# Patient Record
Sex: Female | Born: 1990 | Marital: Married | Smoking: Former smoker
Health system: Southern US, Community
[De-identification: ages and names within clinical notes are randomized; demographics above are authoritative.]

## PROBLEM LIST (undated history)

## (undated) DIAGNOSIS — F419 Anxiety disorder, unspecified: Secondary | ICD-10-CM

## (undated) DIAGNOSIS — D649 Anemia, unspecified: Secondary | ICD-10-CM

## (undated) DIAGNOSIS — I82890 Acute embolism and thrombosis of other specified veins: Secondary | ICD-10-CM

## (undated) DIAGNOSIS — G35 Multiple sclerosis: Secondary | ICD-10-CM

## (undated) HISTORY — DX: Multiple sclerosis: G35

## (undated) HISTORY — PX: WISDOM TOOTH EXTRACTION: SHX21

## (undated) HISTORY — DX: Acute embolism and thrombosis of other specified veins: I82.890

## (undated) HISTORY — DX: Anemia, unspecified: D64.9

## (undated) HISTORY — DX: Anxiety disorder, unspecified: F41.9

---

## 2011-05-22 ENCOUNTER — Ambulatory Visit: Payer: PRIVATE HEALTH INSURANCE | Admitting: Physician Assistant

## 2011-05-22 VITALS — BP 114/74 | HR 106 | Temp 99.4°F | Resp 16 | Ht 63.5 in | Wt 138.0 lb

## 2011-05-22 DIAGNOSIS — J029 Acute pharyngitis, unspecified: Secondary | ICD-10-CM

## 2011-05-22 DIAGNOSIS — J039 Acute tonsillitis, unspecified: Secondary | ICD-10-CM

## 2011-05-22 DIAGNOSIS — R509 Fever, unspecified: Secondary | ICD-10-CM

## 2011-05-22 LAB — POCT CBC
HCT, POC: 39.8 % (ref 37.7–47.9)
Hemoglobin: 12.9 g/dL (ref 12.2–16.2)
Lymph, poc: 4.5 — AB (ref 0.6–3.4)
MCH, POC: 26.5 pg — AB (ref 27–31.2)
MCHC: 32.4 g/dL (ref 31.8–35.4)
WBC: 9 10*3/uL (ref 4.6–10.2)

## 2011-05-22 LAB — POCT RAPID STREP A (OFFICE): Rapid Strep A Screen: NEGATIVE

## 2011-05-22 MED ORDER — MAGIC MOUTHWASH W/LIDOCAINE: ORAL | Status: DC

## 2011-05-22 MED ORDER — IBUPROFEN 200 MG PO TABS
600.0000 mg | ORAL_TABLET | Freq: Once | ORAL | Status: AC
  Administered 2011-05-22: 600 mg via ORAL

## 2011-05-22 NOTE — Progress Notes (Signed)
  Subjective:    Patient ID: Sophia Hampton, female    DOB: 01-24-1991, 21 y.o.   MRN: [id number]  HPI  Pt presents to clinic with 3 days h/o sore throat and fever without sinus congestion or cough.  She does have some PND.  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  She has had mono in the past. She is here because she wants to see someone for a diagnosis.  Review of Systems  Constitutional: Positive for fever (100.3 t max - resolves with tylenol use). Negative for chills.  HENT: Positive for sore throat and postnasal drip. Negative for congestion and rhinorrhea.   Respiratory: Negative for cough.   Gastrointestinal: Negative for nausea, vomiting and diarrhea.  Neurological: Negative for dizziness and headaches.       Objective:   Physical Exam  Constitutional: She is oriented to person, place, and time. She appears well-developed and well-nourished.  HENT:  Head: Normocephalic and atraumatic.  Right Ear: Hearing, tympanic membrane, external ear and ear canal normal.  Left Ear: Hearing, tympanic membrane, external ear and ear canal normal. No decreased hearing is noted.  Nose: Nose normal.  Mouth/Throat: Oropharyngeal exudate, posterior oropharyngeal edema (R>L swollen - not quite touching but close -- no soft palate swelling - no uvula deviation) and posterior oropharyngeal erythema present. No tonsillar abscesses.  Eyes: Conjunctivae are normal.  Neck: Neck supple.  Cardiovascular: Normal rate, regular rhythm and normal heart sounds.   Pulmonary/Chest: Effort normal and breath sounds normal.  Lymphadenopathy:    She has cervical adenopathy (AC enlarged L>R ).  Neurological: She is alert and oriented to person, place, and time.  Skin: Skin is warm and dry.  Psychiatric: She has a normal mood and affect. Her behavior is normal. Judgment and thought content normal.          Assessment & Plan:   1. Acute pharyngitis  POCT rapid strep A,  ibuprofen (ADVIL,MOTRIN) tablet 600 mg, POCT CBC  2. Fever  POCT CBC   Probably strep with a neg result due to abx use - Pt to continue Zithromax because neg strep and nl CBC it is working.  D/w pt use of motrin for pain and continuing tylenol also.  D/w pt because of abx use not completely sure what is happening.  But if she is not improved in 3 days after finished with abx she needs to RTC if she is worse in the next 24-48h RTC because of the possibility of tonsillar abscess due to the fact that on exam her R tonsil is larger than her L but with nl CBC and no soft palate swelling will continue to monitor at this time.

## 2012-01-01 ENCOUNTER — Emergency Department (HOSPITAL_COMMUNITY)
Admission: EM | Admit: 2012-01-01 | Discharge: 2012-01-01 | Disposition: A | Discharge status: Home / Self Care | Payer: Medicaid Other | Attending: Emergency Medicine | Admitting: Emergency Medicine

## 2012-01-01 ENCOUNTER — Encounter (HOSPITAL_COMMUNITY): Payer: Self-pay | Admitting: Emergency Medicine

## 2012-01-01 DIAGNOSIS — R209 Unspecified disturbances of skin sensation: Secondary | ICD-10-CM | POA: Insufficient documentation

## 2012-01-01 DIAGNOSIS — X58XXXA Exposure to other specified factors, initial encounter: Secondary | ICD-10-CM | POA: Insufficient documentation

## 2012-01-01 DIAGNOSIS — Y9239 Other specified sports and athletic area as the place of occurrence of the external cause: Secondary | ICD-10-CM | POA: Insufficient documentation

## 2012-01-01 DIAGNOSIS — Z79899 Other long term (current) drug therapy: Secondary | ICD-10-CM | POA: Insufficient documentation

## 2012-01-01 DIAGNOSIS — T148XXA Other injury of unspecified body region, initial encounter: Secondary | ICD-10-CM | POA: Insufficient documentation

## 2012-01-01 DIAGNOSIS — Y9343 Activity, gymnastics: Secondary | ICD-10-CM | POA: Insufficient documentation

## 2012-01-01 MED ORDER — DIAZEPAM 5 MG PO TABS: 5.0000 mg | ORAL_TABLET | Freq: Two times a day (BID) | ORAL | Status: DC

## 2012-01-01 MED ORDER — KETOROLAC TROMETHAMINE 60 MG/2ML IM SOLN
60.0000 mg | Freq: Once | INTRAMUSCULAR | Status: AC
  Administered 2012-01-01: 60 mg via INTRAMUSCULAR
  Filled 2012-01-01: qty 2

## 2012-01-01 MED ORDER — DIAZEPAM 5 MG PO TABS
5.0000 mg | ORAL_TABLET | Freq: Once | ORAL | Status: AC
Start: 2012-01-01 — End: 2012-01-01
  Administered 2012-01-01: 5 mg via ORAL
  Filled 2012-01-01: qty 1

## 2012-01-01 NOTE — ED Notes (Signed)
Pt presents w/ upper back and shoulder pain (torticollis) after receiving massage from boyfriend. Pt worked out at gym yesterday and felt the need for a massage. Woke during the noc around 0300 w/ upper back pain, pain between shoulder blades, pain when turning head, numbness and tingling both upper extremities. Rx'ed w/ Advil x 2 today.

## 2012-01-01 NOTE — ED Provider Notes (Signed)
History     CSN: [id number]  Arrival date & time 01/01/12  1653   First MD Initiated Contact with Patient 01/01/12 2005      Chief Complaint  Patient presents with  . Neck Pain    upper extremity weakness    (Consider location/radiation/quality/duration/timing/severity/associated sxs/prior treatment) HPI History provided by pt.  Pt developed pain between her shoulder blades while driving at 1am today.  Had been sore yesterday afternoon after using the butterfly machine for the first time at the gym but pain became severe.  Aggravated by raising her arms and rotating head to the right and improved by wearing a soft collar.  Radiates down bilateral upper arms and associated w/ UE paresthesias.  Denies fever, CP, SOB, cough and pain is not pleuritic.  Denies recent trauma.   History reviewed. No pertinent past medical history.  History reviewed. No pertinent past surgical history.  No family history on file.  History  Substance Use Topics  . Smoking status: Never Smoker   . Smokeless tobacco: Never Used  . Alcohol Use: No    OB History    Grav Para Term Preterm Abortions TAB SAB Ect Mult Living                  Review of Systems  All other systems reviewed and are negative.    Allergies  Review of patient's allergies indicates no known allergies.  Home Medications   Current Outpatient Rx  Name  Route  Sig  Dispense  Refill  . NORETHIN ACE-ETH ESTRAD-FE 1-20 MG-MCG PO TABS   Oral   Take 1 tablet by mouth daily.           BP 129/81  Pulse 85  Temp 98.7 F (37.1 C) (Oral)  Resp 18  SpO2 100%  LMP 12/29/2011  Physical Exam  Nursing note and vitals reviewed. Constitutional: She is oriented to person, place, and time. She appears well-developed and well-nourished. No distress.  HENT:  Head: Normocephalic and atraumatic.  Eyes:       Normal appearance  Neck: Normal range of motion.  Cardiovascular: Normal rate, regular rhythm and intact distal pulses.    Pulmonary/Chest: Effort normal and breath sounds normal. No respiratory distress.       Pt points to pain at right upper back.  No tenderness in this location.  Entire spine non-tender.  Pain reproduced w/ abduction both upper extremities.    Musculoskeletal: Normal range of motion. She exhibits no edema and no tenderness.       Upper and LE strength and sensation nml.    Neurological: She is alert and oriented to person, place, and time.  Skin: Skin is warm and dry. No rash noted.  Psychiatric: She has a normal mood and affect. Her behavior is normal.    ED Course  Procedures (including critical care time)  Labs Reviewed - No data to display No results found.   1. Muscle strain      Medications  diazepam (VALIUM) 5 MG tablet (not administered)  ketorolac (TORADOL) injection 60 mg (60 mg Intramuscular Given 01/01/12 2041)  diazepam (VALIUM) tablet 5 mg (5 mg Oral Given 01/01/12 2042)     MDM  21yo healthy F presents w/ c/o non-traumatic upper back pain.  Used butterfly machine for the first time at gym yesterday, pain aggravated by head and UE ROM and improves w/ wearing soft collar.  Suspect muscle strain.  Doubt pneumonia; no fever or cough and doubt PE,  low risk, pain is not pleuritic, nml VS and no signs of DVT on exam.  Pt to receive IM toradol and po valium.  Will reassess shortly. 8:29 PM   Pain much improved.  Return precautions discussed.  Pt d/c'd home.  9:27 PM         Otilio Miu, [place] 01/01/12 2127

## 2012-01-01 NOTE — ED Notes (Addendum)
Pt reports pain between scapula that radiates to her right arm. Progressively worsened on yesterday. Seen at urgent care, and was encouraged to come to the ED.   Pt has a neck brace on. Was placed on for the idea of support. States that when she wears the neck brace it helps minimize pain with movement. She has sharp catch in her neck when she turns head from side to side..driving

## 2012-01-04 NOTE — ED Provider Notes (Signed)
Medical screening examination/treatment/procedure(s) were performed by non-physician practitioner and as supervising physician I was immediately available for consultation/collaboration.  Abrian Hanover R. Ashlan Dignan, MD 01/04/12 0702 

## 2012-10-14 ENCOUNTER — Emergency Department (HOSPITAL_COMMUNITY)
Admission: EM | Admit: 2012-10-14 | Discharge: 2012-10-14 | Disposition: A | Discharge status: Home / Self Care | Payer: Medicaid Other | Attending: Emergency Medicine | Admitting: Emergency Medicine

## 2012-10-14 ENCOUNTER — Encounter (HOSPITAL_COMMUNITY): Payer: Self-pay | Admitting: *Deleted

## 2012-10-14 DIAGNOSIS — M436 Torticollis: Secondary | ICD-10-CM | POA: Insufficient documentation

## 2012-10-14 DIAGNOSIS — Z7982 Long term (current) use of aspirin: Secondary | ICD-10-CM | POA: Insufficient documentation

## 2012-10-14 DIAGNOSIS — Z87891 Personal history of nicotine dependence: Secondary | ICD-10-CM | POA: Insufficient documentation

## 2012-10-14 DIAGNOSIS — Z79899 Other long term (current) drug therapy: Secondary | ICD-10-CM | POA: Insufficient documentation

## 2012-10-14 MED ORDER — KETOROLAC TROMETHAMINE 30 MG/ML IJ SOLN
30.0000 mg | Freq: Once | INTRAMUSCULAR | Status: AC
  Administered 2012-10-14: 30 mg via INTRAMUSCULAR
  Filled 2012-10-14: qty 1

## 2012-10-14 MED ORDER — DIAZEPAM 5 MG PO TABS: 5.0000 mg | ORAL_TABLET | Freq: Four times a day (QID) | ORAL | Status: DC | PRN

## 2012-10-14 MED ORDER — DIAZEPAM 5 MG PO TABS
5.0000 mg | ORAL_TABLET | Freq: Once | ORAL | Status: AC
  Administered 2012-10-14: 5 mg via ORAL
  Filled 2012-10-14: qty 1

## 2012-10-14 NOTE — ED Provider Notes (Signed)
CSN: [id number]     Arrival date & time 10/14/12  2014 History  This chart was scribed for non-physician practitioner, Earley Favor, FNP working with Shon Baton, MD by Greggory Stallion, ED scribe. This patient was seen in room WTR6/WTR6 and the patient's care was started at 9:16 PM.   Chief Complaint  Patient presents with  . Torticollis   The history is provided by the patient. No language interpreter was used.    HPI Comments: Sophia Hampton is a 22 y.o. female who presents to the Emergency Department complaining of sudden onset, constant neck spasms that started this morning when she woke up. Pt denies injury. She states she has taken tramadol and advil with no relief. Pt can not move her neck and has placed a soft cervical color for comfort. She has no other associated symptoms. LNMP was 3 weeks ago.   History reviewed. No pertinent past medical history. History reviewed. No pertinent past surgical history. No family history on file. History  Substance Use Topics  . Smoking status: Former Games developer  . Smokeless tobacco: Never Used  . Alcohol Use: Yes   OB History   Grav Para Term Preterm Abortions TAB SAB Ect Mult Living                 Review of Systems  HENT: Positive for neck pain.   All other systems reviewed and are negative.    Allergies  Review of patient's allergies indicates no known allergies.  Home Medications   Current Outpatient Rx  Name  Route  Sig  Dispense  Refill  . aspirin 325 MG tablet   Oral   Take 325 mg by mouth daily.         Marland Kitchen escitalopram (LEXAPRO) 10 MG tablet   Oral   Take 10 mg by mouth daily.         Marland Kitchen ibuprofen (ADVIL,MOTRIN) 200 MG tablet   Oral   Take 400 mg by mouth every 6 (six) hours as needed for pain.         Marland Kitchen norethindrone-ethinyl estradiol (JUNEL FE,GILDESS FE,LOESTRIN FE) 1-20 MG-MCG tablet   Oral   Take 1 tablet by mouth daily.         . traMADol (ULTRAM) 50 MG tablet   Oral   Take 50 mg by mouth every 6  (six) hours as needed for pain.         . diazepam (VALIUM) 5 MG tablet   Oral   Take 1 tablet (5 mg total) by mouth every 6 (six) hours as needed for anxiety.   12 tablet   0    BP 131/80  Pulse 75  Temp(Src) 98.5 F (36.9 C) (Oral)  Resp 18  Wt 126 lb (57.153 kg)  BMI 21.97 kg/m2  SpO2 100%  LMP 09/14/2012  Physical Exam  Nursing note and vitals reviewed. Constitutional: She is oriented to person, place, and time. She appears well-developed and well-nourished. No distress.  HENT:  Head: Normocephalic and atraumatic.  Eyes: EOM are normal.  Neck: Neck supple. No tracheal deviation present.  Cardiovascular: Normal rate.   Pulmonary/Chest: Effort normal. No respiratory distress.  Musculoskeletal: Normal range of motion.  Neurological: She is alert and oriented to person, place, and time.  Skin: Skin is warm and dry.  Psychiatric: She has a normal mood and affect. Her behavior is normal.    ED Course   Procedures (including critical care time)  DIAGNOSTIC STUDIES: Oxygen Saturation is 100%  on RA, normal by my interpretation.    COORDINATION OF CARE: 9:18 PM-Discussed treatment plan which includes a muscle relaxer and pain medication with pt at bedside and pt agreed to plan.   Labs Reviewed - No data to display No results found. 1. Neck stiffness     MDM    Will dc home with Rx for Valium      I personally performed the services described in this documentation, which was scribed in my presence. The recorded information has been reviewed and is accurate.   Arman Filter, NP 10/15/12 [phone]

## 2012-10-14 NOTE — ED Notes (Signed)
Pt states if she moves her neck "it spasms" she has placed a soft cervical collar on for comfort, states she was seen for the same in Nov

## 2012-10-15 NOTE — ED Provider Notes (Signed)
Medical screening examination/treatment/procedure(s) were performed by non-physician practitioner and as supervising physician I was immediately available for consultation/collaboration.  Shon Baton, MD 10/15/12 [phone]

## 2013-02-12 DIAGNOSIS — G4700 Insomnia, unspecified: Secondary | ICD-10-CM | POA: Diagnosis present

## 2013-02-12 DIAGNOSIS — F32A Depression, unspecified: Secondary | ICD-10-CM | POA: Diagnosis present

## 2014-05-07 ENCOUNTER — Ambulatory Visit: Payer: PRIVATE HEALTH INSURANCE | Admitting: Neurology

## 2014-06-03 IMAGING — US US PELVIS COMPLETE
1 series · 14 of 25 positions shown · non-contrast
Comparison: None

CLINICAL DATA: Acute onset of lower abdominal pain. Initial
encounter.

EXAM:
TRANSABDOMINAL AND TRANSVAGINAL ULTRASOUND OF PELVIS
TECHNIQUE: Both transabdominal and transvaginal ultrasound examinations of the
pelvis were performed. Transabdominal technique was performed for
global imaging of the pelvis including uterus, ovaries, adnexal
regions, and pelvic cul-de-sac. It was necessary to proceed with
endovaginal exam following the transabdominal exam to visualize the
uterus and ovaries in greater detail.

[Series 1: us pelvis complete · 0.24mm/px · 14 of 58 slices shown]
[im 1/58]
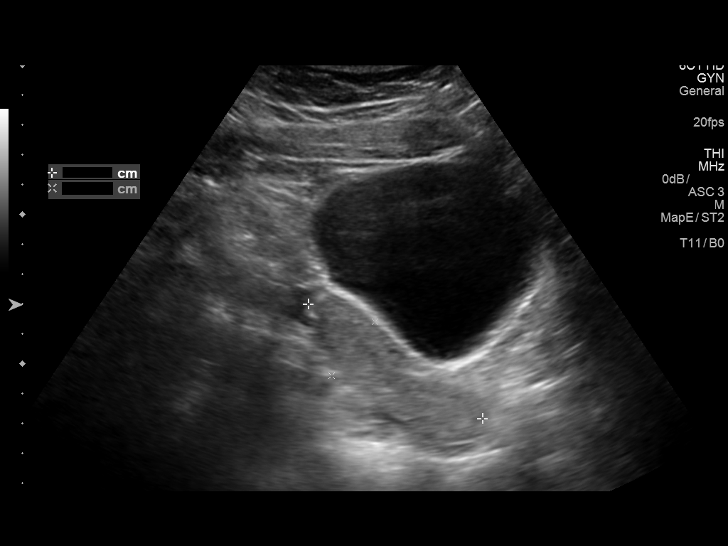
[im 5/58]
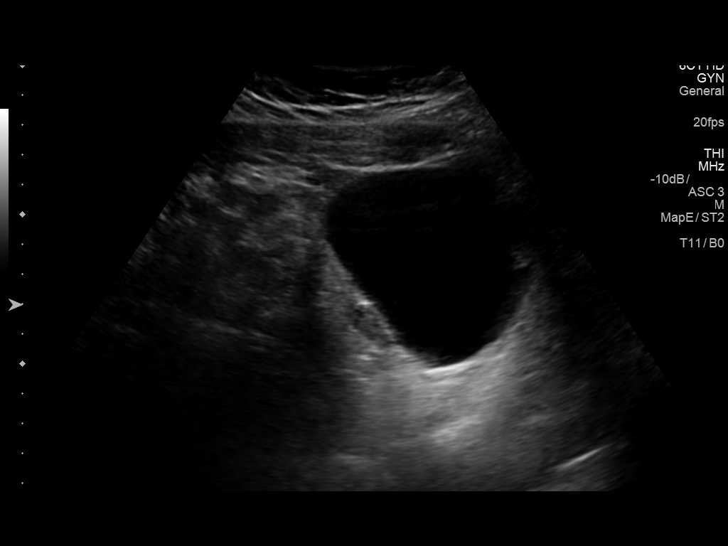
[im 10/58]
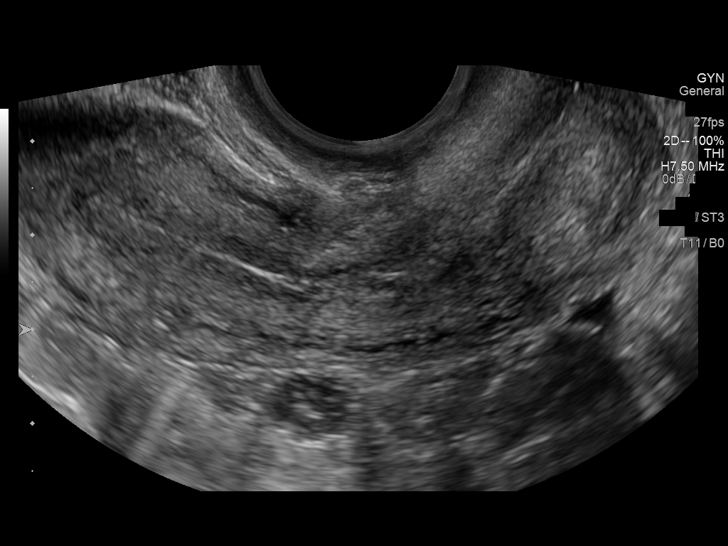
[im 15/58]
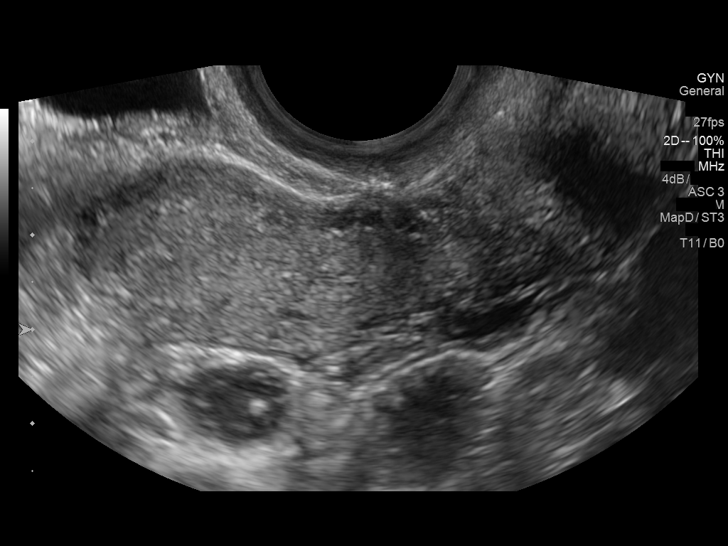
[im 20/58]
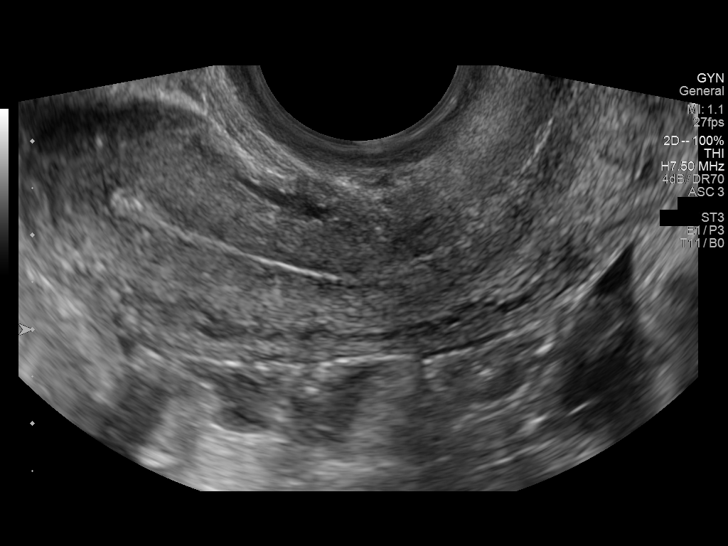
[im 22/58]
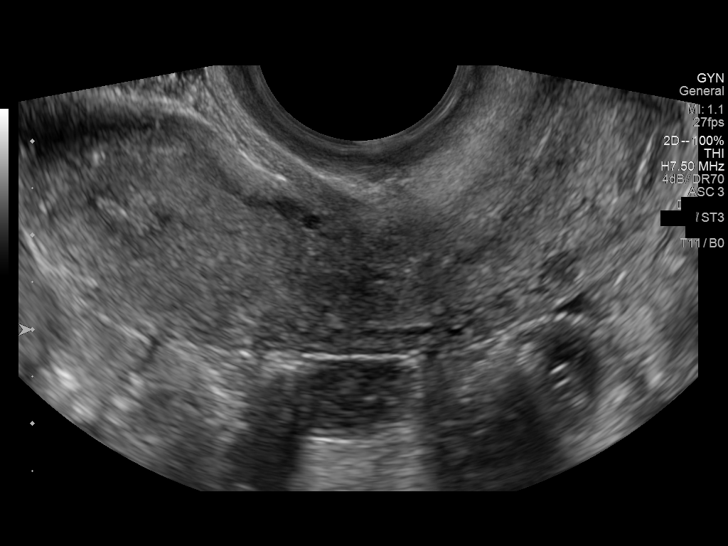
[im 27/58]
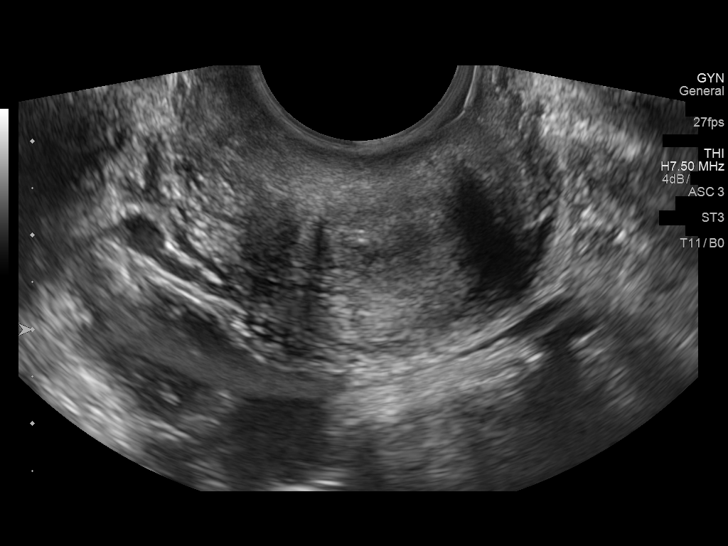
[im 31/58]
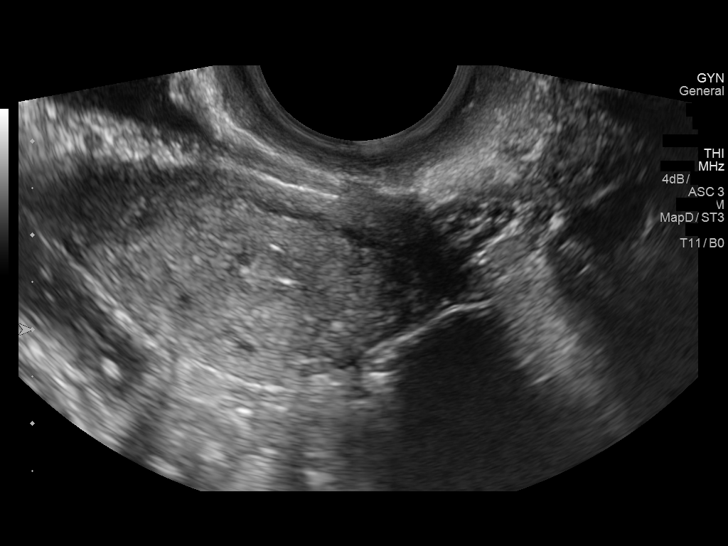
[im 36/58]
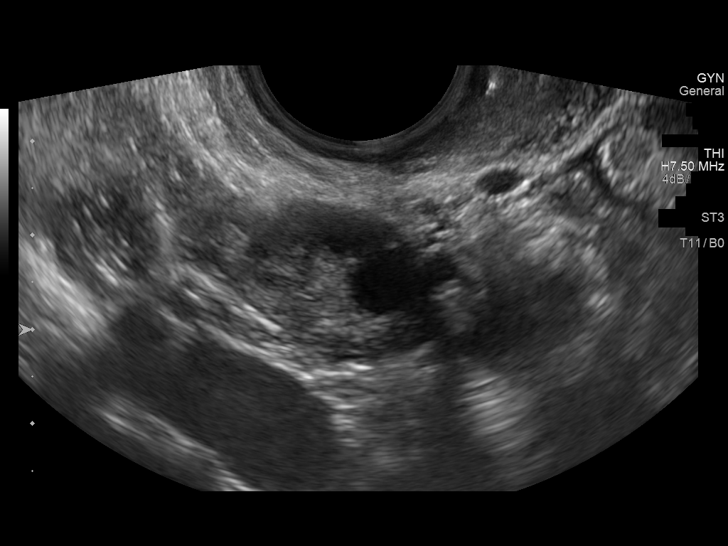
[im 39/58]
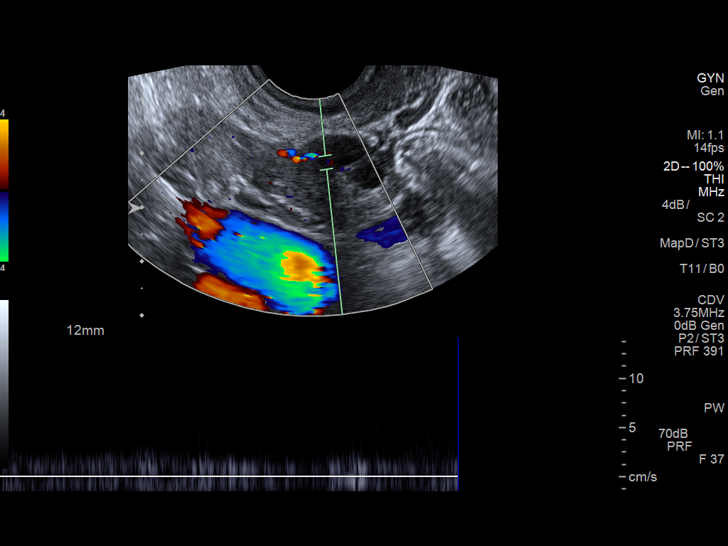
[im 43/58]
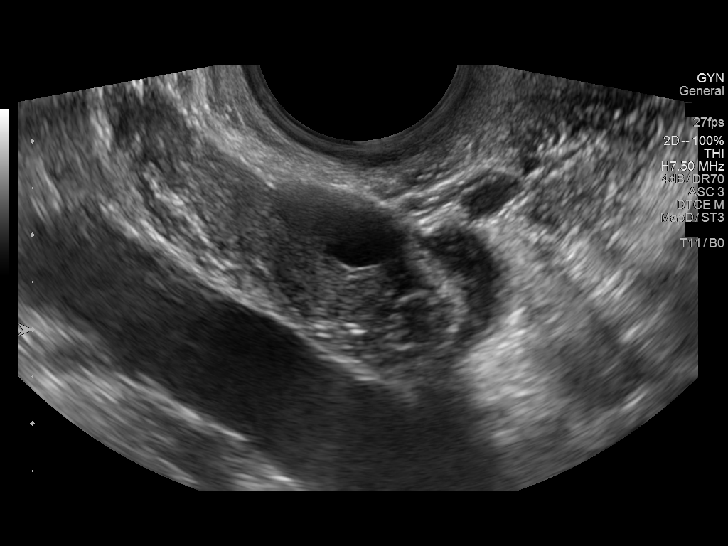
[im 48/58]
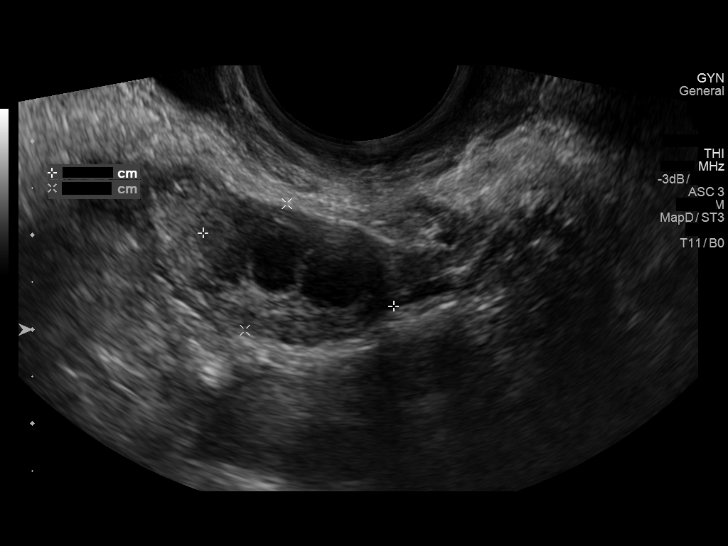
[im 53/58]
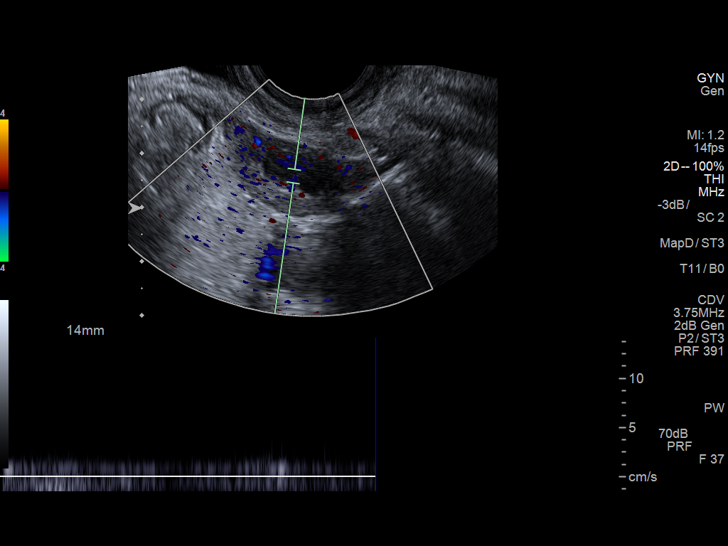
[im 58/58]
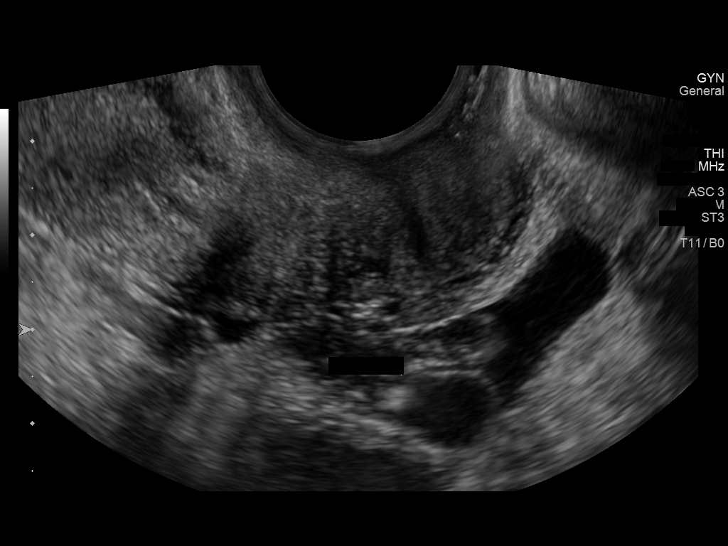

[14 of 25 positions shown; findings below may reference images not displayed]

FINDINGS: Uterus

Measurements: 7.0 x 2.3 x 3.8 cm. No fibroids or other mass
visualized.

Endometrium

Thickness: 0.3 cm.  No focal abnormality visualized.

Right ovary

Measurements: 2.2 x 1.6 x 1.7 cm. Normal appearance/no adnexal mass.

Left ovary

Measurements: 2.2 x 1.4 x 1.8 cm. Normal appearance/no adnexal mass.

Other findings

Trace relatively simple free fluid is seen within the pelvic
cul-de-sac, likely physiologic in nature.
IMPRESSION: 1. Uterus unremarkable in appearance. No evidence for ovarian
torsion.
2. Trace relatively simple free fluid within the pelvic cul-de-sac,
likely physiologic in nature.

## 2014-12-31 ENCOUNTER — Emergency Department (HOSPITAL_COMMUNITY)
Admission: EM | Admit: 2014-12-31 | Discharge: 2014-12-31 | Disposition: A | Discharge status: Home / Self Care | Payer: 59 | Source: Home / Self Care | Attending: Family Medicine | Admitting: Family Medicine

## 2014-12-31 ENCOUNTER — Encounter (HOSPITAL_COMMUNITY): Payer: Self-pay | Admitting: *Deleted

## 2014-12-31 DIAGNOSIS — S29012A Strain of muscle and tendon of back wall of thorax, initial encounter: Secondary | ICD-10-CM | POA: Diagnosis not present

## 2014-12-31 MED ORDER — METAXALONE 800 MG PO TABS: 800.0000 mg | ORAL_TABLET | Freq: Three times a day (TID) | ORAL | Status: DC

## 2014-12-31 MED ORDER — DICLOFENAC POTASSIUM 50 MG PO TABS: 50.0000 mg | ORAL_TABLET | Freq: Three times a day (TID) | ORAL | Status: DC

## 2014-12-31 NOTE — ED Provider Notes (Signed)
CSN: [id number]     Arrival date & time 12/31/14  1740 History   First MD Initiated Contact with Patient 12/31/14 1853     Chief Complaint  Patient presents with  . Back Pain   (Consider location/radiation/quality/duration/timing/severity/associated sxs/prior Treatment) Patient is a 24 y.o. female presenting with back pain. The history is provided by the patient.  Back Pain Location:  Thoracic spine Quality:  Stabbing Radiates to:  Does not radiate Pain severity:  Mild Onset quality:  Gradual Duration:  2 weeks Progression:  Unchanged Chronicity:  New Context: emotional stress   Context: not lifting heavy objects   Ineffective treatments:  Muscle relaxants Associated symptoms: no abdominal pain, no abdominal swelling, no fever and no paresthesias     History reviewed. No pertinent past medical history. History reviewed. No pertinent past surgical history. History reviewed. No pertinent family history. Social History  Substance Use Topics  . Smoking status: Former Games developer  . Smokeless tobacco: Never Used  . Alcohol Use: Yes   OB History    No data available     Review of Systems  Constitutional: Negative.  Negative for fever.  Gastrointestinal: Negative.  Negative for abdominal pain.  Musculoskeletal: Positive for back pain and neck pain. Negative for joint swelling, gait problem and neck stiffness.  Skin: Negative.   Neurological: Negative for paresthesias.  All other systems reviewed and are negative.   Allergies  Review of patient's allergies indicates no known allergies.  Home Medications   Prior to Admission medications   Medication Sig Start Date End Date Taking? Authorizing Provider  aspirin 325 MG tablet Take 325 mg by mouth daily.    Historical Provider, MD  diazepam (VALIUM) 5 MG tablet Take 1 tablet (5 mg total) by mouth every 6 (six) hours as needed for anxiety. 10/14/12   Earley Favor, NP  diclofenac (CATAFLAM) 50 MG tablet Take 1 tablet (50 mg total)  by mouth 3 (three) times daily. 12/31/14   Linna Hoff, MD  escitalopram (LEXAPRO) 10 MG tablet Take 10 mg by mouth daily.    Historical Provider, MD  ibuprofen (ADVIL,MOTRIN) 200 MG tablet Take 400 mg by mouth every 6 (six) hours as needed for pain.    Historical Provider, MD  metaxalone (SKELAXIN) 800 MG tablet Take 1 tablet (800 mg total) by mouth 3 (three) times daily. As muscle relaxer 12/31/14   Linna Hoff, MD  norethindrone-ethinyl estradiol (JUNEL FE,GILDESS FE,LOESTRIN FE) 1-20 MG-MCG tablet Take 1 tablet by mouth daily.    Historical Provider, MD  traMADol (ULTRAM) 50 MG tablet Take 50 mg by mouth every 6 (six) hours as needed for pain.    Historical Provider, MD   Meds Ordered and Administered this Visit  Medications - No data to display  BP 122/80 mmHg  Pulse 76  Temp(Src) 97.7 F (36.5 C) (Oral)  Resp 16  SpO2 100%  LMP 12/31/2014 No data found.   Physical Exam  Constitutional: She is oriented to person, place, and time. She appears well-developed and well-nourished. No distress.  Cardiovascular: Normal rate, regular rhythm, normal heart sounds and intact distal pulses.   Pulmonary/Chest: Effort normal and breath sounds normal.  Abdominal: Soft. Bowel sounds are normal.  Musculoskeletal: She exhibits tenderness.       Back:  Neurological: She is alert and oriented to person, place, and time.  Skin: Skin is warm and dry.  Nursing note and vitals reviewed.   ED Course  Procedures (including critical care time)  Labs  Review Labs Reviewed - No data to display  Imaging Review No results found.   Visual Acuity Review  Right Eye Distance:   Left Eye Distance:   Bilateral Distance:    Right Eye Near:   Left Eye Near:    Bilateral Near:         MDM   1. Rhomboid muscle strain, initial encounter    rx skelaxin, diclofenac and heat and stretch.   Linna Hoff, MD 12/31/14 Barry Brunner

## 2014-12-31 NOTE — Discharge Instructions (Signed)
Heat, stretch and medicine as prescribed. Return if needed.

## 2014-12-31 NOTE — ED Notes (Signed)
Pt  Reports      Upper  Back  Neck  And   Shoulder     Pain  For  sev  Weeks        denys  Any  specefic  Injury      seen  At  Another  Urgent  Care     And  Was     rx      Muscle  Relaxant

## 2015-02-02 ENCOUNTER — Emergency Department (HOSPITAL_COMMUNITY): Payer: 59

## 2015-02-02 ENCOUNTER — Emergency Department (HOSPITAL_COMMUNITY)
Admission: EM | Admit: 2015-02-02 | Discharge: 2015-02-03 | Disposition: A | Discharge status: Home / Self Care | Payer: 59 | Attending: Emergency Medicine | Admitting: Emergency Medicine

## 2015-02-02 ENCOUNTER — Encounter (HOSPITAL_COMMUNITY): Payer: Self-pay | Admitting: Family Medicine

## 2015-02-02 DIAGNOSIS — Z3202 Encounter for pregnancy test, result negative: Secondary | ICD-10-CM | POA: Insufficient documentation

## 2015-02-02 DIAGNOSIS — N898 Other specified noninflammatory disorders of vagina: Secondary | ICD-10-CM | POA: Insufficient documentation

## 2015-02-02 DIAGNOSIS — Z79899 Other long term (current) drug therapy: Secondary | ICD-10-CM | POA: Diagnosis not present

## 2015-02-02 DIAGNOSIS — Z793 Long term (current) use of hormonal contraceptives: Secondary | ICD-10-CM | POA: Diagnosis not present

## 2015-02-02 DIAGNOSIS — R509 Fever, unspecified: Secondary | ICD-10-CM | POA: Diagnosis not present

## 2015-02-02 DIAGNOSIS — R1030 Lower abdominal pain, unspecified: Secondary | ICD-10-CM

## 2015-02-02 DIAGNOSIS — R110 Nausea: Secondary | ICD-10-CM | POA: Diagnosis not present

## 2015-02-02 DIAGNOSIS — R1031 Right lower quadrant pain: Secondary | ICD-10-CM | POA: Diagnosis present

## 2015-02-02 DIAGNOSIS — R109 Unspecified abdominal pain: Secondary | ICD-10-CM

## 2015-02-02 LAB — CBC
HEMATOCRIT: 42.7 % (ref 36.0–46.0)
HEMOGLOBIN: 13.8 g/dL (ref 12.0–15.0)
MCH: 27.3 pg (ref 26.0–34.0)
MCHC: 32.3 g/dL (ref 30.0–36.0)
MCV: 84.6 fL (ref 78.0–100.0)
Platelets: 246 10*3/uL (ref 150–400)
RBC: 5.05 MIL/uL (ref 3.87–5.11)
RDW: 13.4 % (ref 11.5–15.5)
WBC: 8.1 10*3/uL (ref 4.0–10.5)

## 2015-02-02 LAB — COMPREHENSIVE METABOLIC PANEL
ALBUMIN: 3.7 g/dL (ref 3.5–5.0)
ALK PHOS: 52 U/L (ref 38–126)
ALT: 22 U/L (ref 14–54)
AST: 19 U/L (ref 15–41)
Anion gap: 9 (ref 5–15)
BILIRUBIN TOTAL: 1 mg/dL (ref 0.3–1.2)
BUN: 9 mg/dL (ref 6–20)
CALCIUM: 9.7 mg/dL (ref 8.9–10.3)
CO2: 26 mmol/L (ref 22–32)
Chloride: 104 mmol/L (ref 101–111)
Creatinine, Ser: 0.82 mg/dL (ref 0.44–1.00)
GFR calc Af Amer: >60 mL/min (ref >60)
GFR calc non Af Amer: >60 mL/min (ref >60)
GLUCOSE: 90 mg/dL (ref 65–99)
POTASSIUM: 3.9 mmol/L (ref 3.5–5.1)
Sodium: 139 mmol/L (ref 135–145)
TOTAL PROTEIN: 7 g/dL (ref 6.5–8.1)

## 2015-02-02 LAB — I-STAT BETA HCG BLOOD, ED (MC, WL, AP ONLY): I-stat hCG, quantitative: <5 m[IU]/mL (ref <5)

## 2015-02-02 LAB — WET PREP, GENITAL
Clue Cells Wet Prep HPF POC: NONE SEEN
SPERM: NONE SEEN
TRICH WET PREP: NONE SEEN
YEAST WET PREP: NONE SEEN

## 2015-02-02 LAB — URINALYSIS, ROUTINE W REFLEX MICROSCOPIC
BILIRUBIN URINE: NEGATIVE
Glucose, UA: NEGATIVE mg/dL
Ketones, ur: NEGATIVE mg/dL
Leukocytes, UA: NEGATIVE
NITRITE: NEGATIVE
PH: 6.5 (ref 5.0–8.0)
PROTEIN: 100 mg/dL — AB
SPECIFIC GRAVITY, URINE: 1.005 (ref 1.005–1.030)

## 2015-02-02 LAB — LIPASE, BLOOD: Lipase: 22 U/L (ref 11–51)

## 2015-02-02 LAB — URINE MICROSCOPIC-ADD ON: Bacteria, UA: NONE SEEN

## 2015-02-02 IMAGING — CT CT ABD-PELV W/ CM
2 of 4 series · 11 of 46 positions shown, 12 images · IV contrast (Iodine)
Comparison: None.

CLINICAL DATA: 24-year-old female with right lower quadrant
abdominal pain

EXAM:
CT ABDOMEN AND PELVIS WITH CONTRAST
TECHNIQUE: Multidetector CT imaging of the abdomen and pelvis was performed
using the standard protocol following bolus administration of
intravenous contrast.
CONTRAST:  80mL OMNIPAQUE IOHEXOL 300 MG/ML  SOLN

[Series 201: routine, idose (2) · axial · 0.71mm/px · z∈[-126,+229]mm · 8 of 87 slices shown, 9 images]
[im 8/87  soft-tissue]
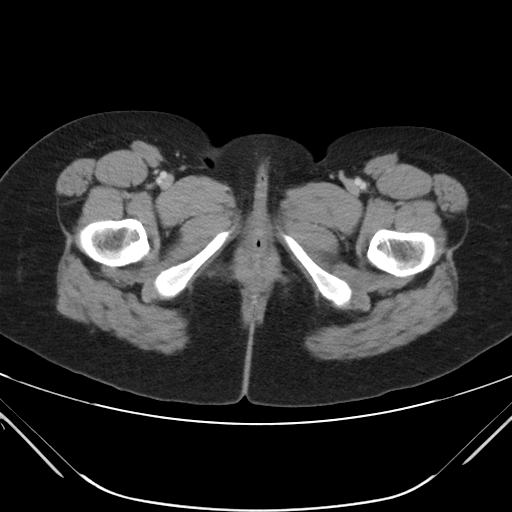
[im 8/87  bone]
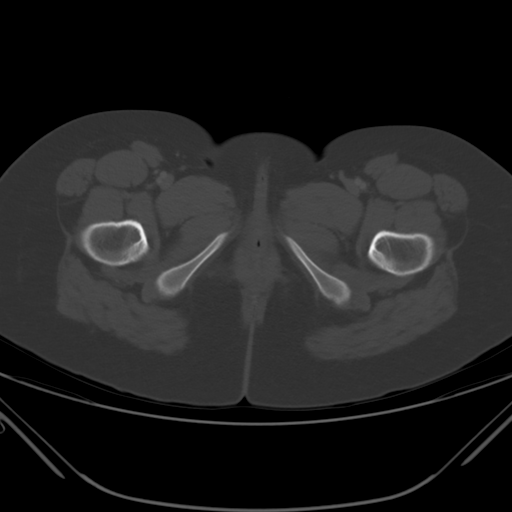
[im 18/87  soft-tissue]
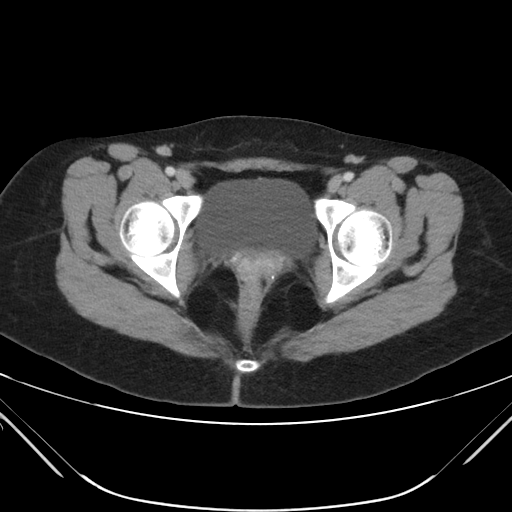
[im 29/87  soft-tissue]
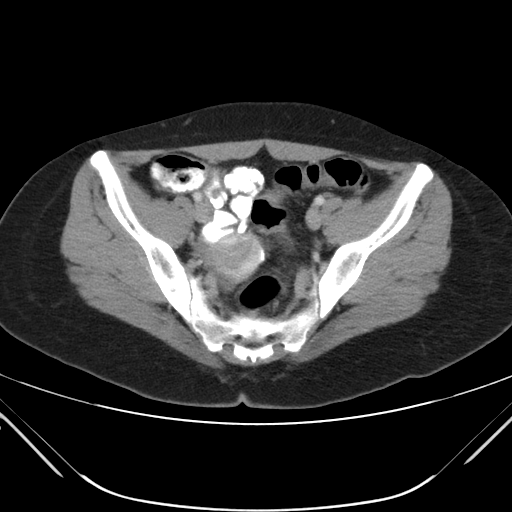
[im 40/87  soft-tissue]
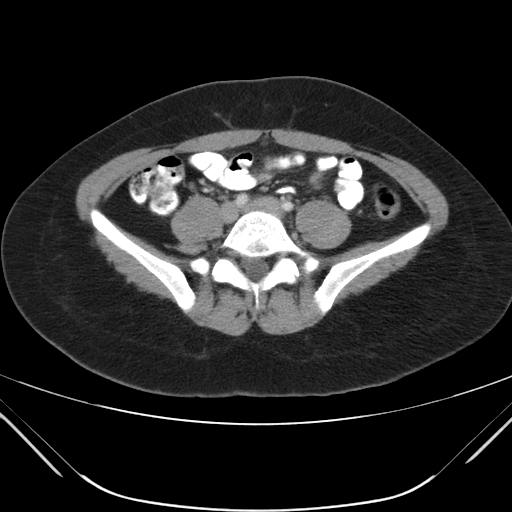
[im 47/87  soft-tissue]
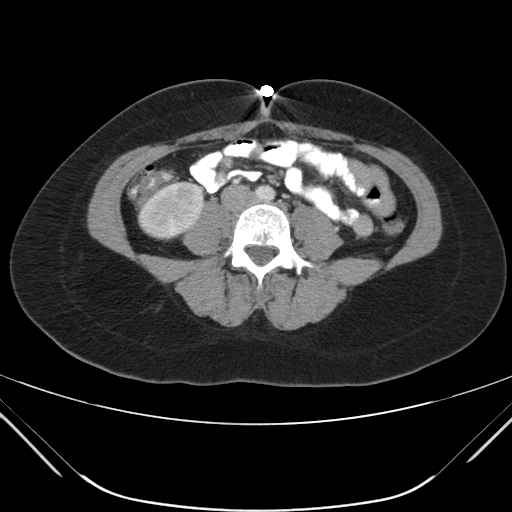
[im 58/87  soft-tissue]
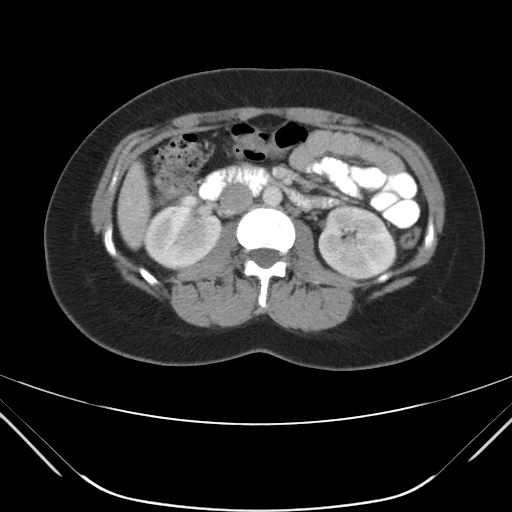
[im 69/87  soft-tissue]
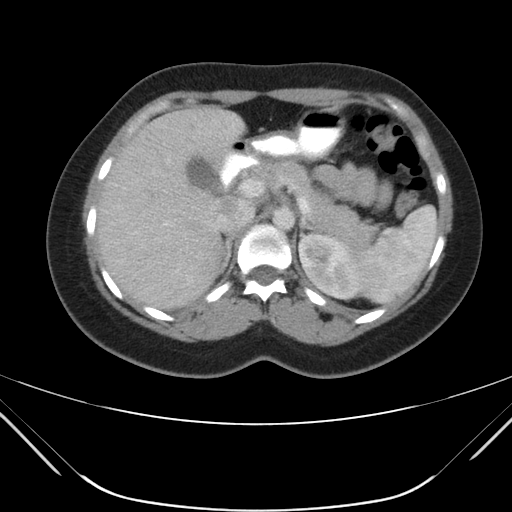
[im 79/87  soft-tissue]
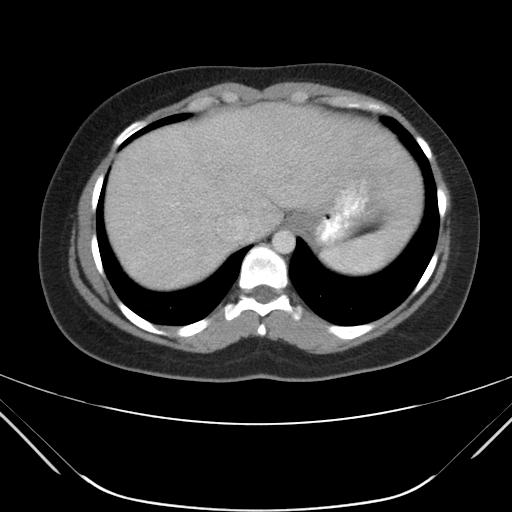

[Series 203: coronals, idose (2) · coronal · 0.45mm/px · 3 of 122 slices shown]
[im 41/122  soft-tissue]
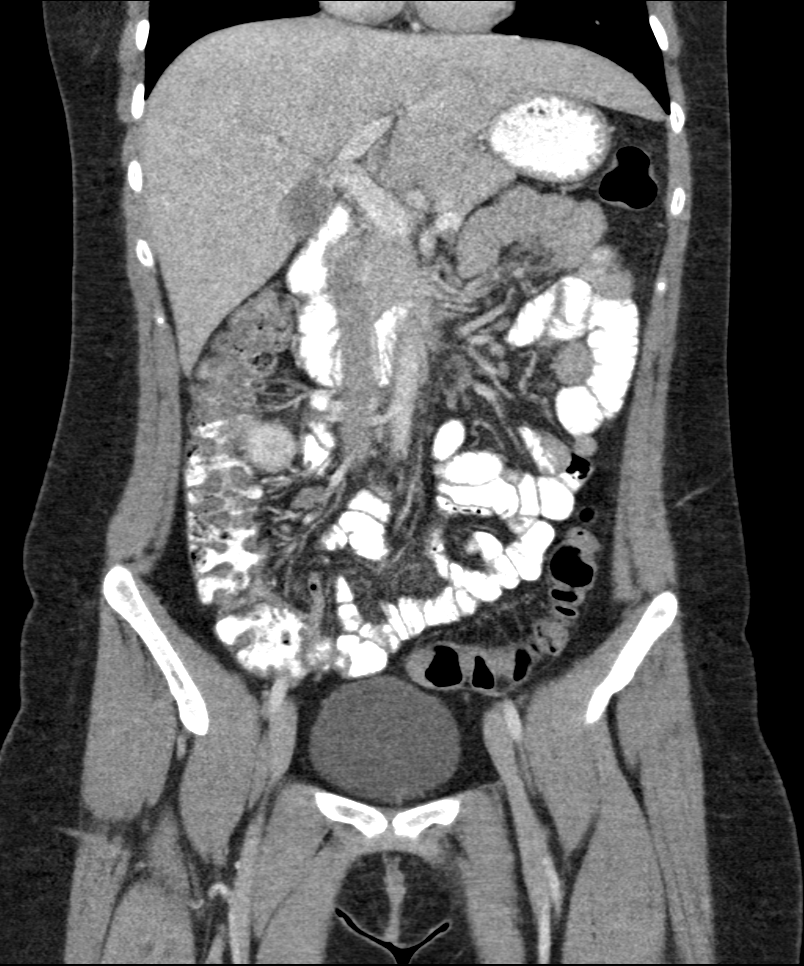
[im 54/122  soft-tissue]
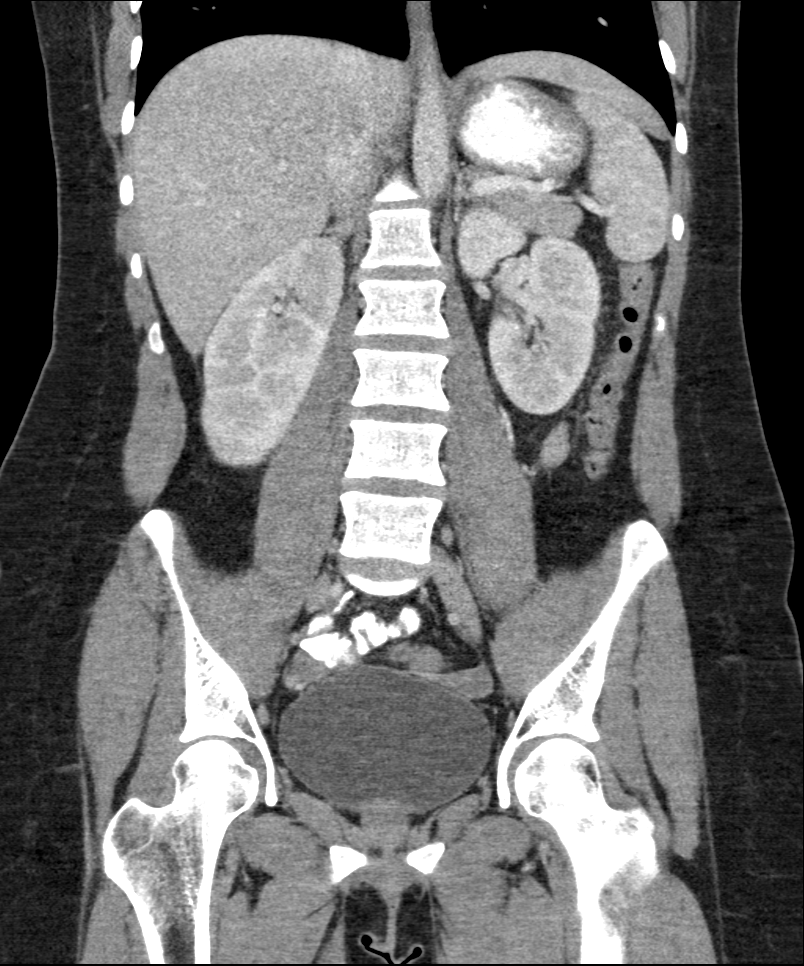
[im 68/122  soft-tissue]
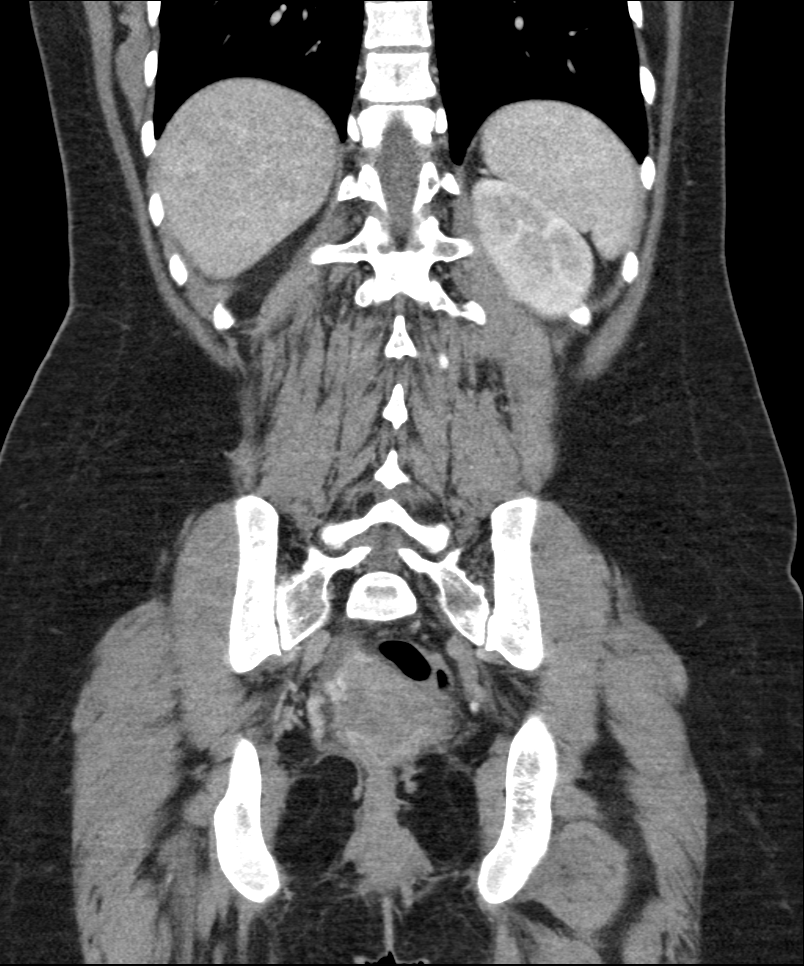

[11 of 46 positions shown; findings below may reference images not displayed]

FINDINGS: The visualized lung bases are clear. No intra-abdominal free air.
Small slightly high attenuating fluid noted in the right posterior
hemipelvis, likely complex fluid.

The liver, gallbladder, pancreas, spleen, adrenal glands, left
kidney, left ureter, and urinary bladder appear unremarkable. There
is incomplete rotation of the right kidney. There is mild haziness
at the right renal sinus fat. Correlation with urinalysis
recommended to exclude a. there is no hydronephrosis on either side.
The uterus is anteverted and appears grossly unremarkable. A small
corpus luteum may be present in the right ovary. Ultrasound may
provide better evaluation of the pelvic structures.

There is no evidence of bowel obstruction or inflammation. Normal
appendix. The abdominal aorta and IVC are patent. No portal venous
gas identified. There is no adenopathy. Small fat containing
umbilical hernia. The abdominal wall soft tissues appear
unremarkable. The osseous structures are intact.
IMPRESSION: No evidence of bowel obstruction or inflammation.  Normal appendix.

Small minimally complex fluid within the pelvis possibly related to
right ovarian follicular rupture. Pelvic ultrasound may provide
better evaluation.

Minimal stranding of the right renal pelvis fat. Correlation with
urinalysis recommended to exclude UTI.

## 2015-02-02 MED ORDER — ONDANSETRON 4 MG PO TBDP
4.0000 mg | ORAL_TABLET | Freq: Once | ORAL | Status: AC
  Administered 2015-02-02: 4 mg via ORAL
  Filled 2015-02-02: qty 1

## 2015-02-02 MED ORDER — OXYCODONE-ACETAMINOPHEN 5-325 MG PO TABS
ORAL_TABLET | ORAL | Status: AC
  Filled 2015-02-02: qty 1

## 2015-02-02 MED ORDER — IOHEXOL 300 MG/ML  SOLN
80.0000 mL | Freq: Once | INTRAMUSCULAR | Status: AC | PRN
  Administered 2015-02-02: 80 mL via INTRAVENOUS

## 2015-02-02 MED ORDER — OXYCODONE-ACETAMINOPHEN 5-325 MG PO TABS
1.0000 | ORAL_TABLET | Freq: Once | ORAL | Status: AC
  Administered 2015-02-02: 1 via ORAL
  Filled 2015-02-02: qty 1

## 2015-02-02 MED ORDER — OXYCODONE-ACETAMINOPHEN 5-325 MG PO TABS
1.0000 | ORAL_TABLET | Freq: Once | ORAL | Status: AC
  Administered 2015-02-02: 1 via ORAL

## 2015-02-02 MED ORDER — SODIUM CHLORIDE 0.9 % IV BOLUS (SEPSIS)
1000.0000 mL | Freq: Once | INTRAVENOUS | Status: AC
  Administered 2015-02-02: 1000 mL via INTRAVENOUS

## 2015-02-02 NOTE — ED Notes (Signed)
CT notified pt completed contrast.  

## 2015-02-02 NOTE — ED Notes (Signed)
CT called for wait time and informed RN will assist for transport.

## 2015-02-02 NOTE — ED Provider Notes (Signed)
CSN: [id number]     Arrival date & time 02/02/15  1249 History   First MD Initiated Contact with Patient 02/02/15 1750     Chief Complaint  Patient presents with  . Abdominal Pain    HPI   Sophia Hampton is a 24 y.o. female with no pertinent PMH who presents to the ED with constant lower abdominal pain and nausea x 2 days. She states her pain is located in her RLQ and radiates across her abdomen. She reports fever to 100.6 at home. She denies exacerbating factors. She has not tried anything for symptom relief. She was evaluated at urgent care today and instructed to come to the ED for possible appendicitis. She denies vomiting, diarrhea, constipation, dysuria, urgency, frequency, vaginal discharge. She states she just finished her menstrual cycle.    History reviewed. No pertinent past medical history. History reviewed. No pertinent past surgical history. History reviewed. No pertinent family history. Social History  Substance Use Topics  . Smoking status: Former Games developer  . Smokeless tobacco: Never Used  . Alcohol Use: Yes   OB History    No data available       Review of Systems  Constitutional: Positive for fever. Negative for chills.  Respiratory: Negative for shortness of breath.   Cardiovascular: Negative for chest pain.  Gastrointestinal: Positive for nausea and abdominal pain. Negative for vomiting, diarrhea, constipation and blood in stool.  Genitourinary: Negative for dysuria, urgency, frequency and vaginal discharge.  All other systems reviewed and are negative.     Allergies  Review of patient's allergies indicates no known allergies.  Home Medications   Prior to Admission medications   Medication Sig Start Date End Date Taking? Authorizing Provider  acetaminophen (TYLENOL) 325 MG tablet Take 650 mg by mouth every 6 (six) hours as needed for mild pain or moderate pain.   Yes Historical Provider, MD  escitalopram (LEXAPRO) 10 MG tablet Take 15 mg by mouth daily.     Yes Historical Provider, MD  norethindrone-ethinyl estradiol (JUNEL FE,GILDESS FE,LOESTRIN FE) 1-20 MG-MCG tablet Take 1 tablet by mouth daily.   Yes Historical Provider, MD  diazepam (VALIUM) 5 MG tablet Take 1 tablet (5 mg total) by mouth every 6 (six) hours as needed for anxiety. Patient not taking: Reported on 02/02/2015 10/14/12   Earley Favor, NP  diclofenac (CATAFLAM) 50 MG tablet Take 1 tablet (50 mg total) by mouth 3 (three) times daily. Patient not taking: Reported on 02/02/2015 12/31/14   Linna Hoff, MD  metaxalone (SKELAXIN) 800 MG tablet Take 1 tablet (800 mg total) by mouth 3 (three) times daily. As muscle relaxer Patient not taking: Reported on 02/02/2015 12/31/14   Linna Hoff, MD    BP 120/84 mmHg  Pulse 60  Temp(Src) 98.7 F (37.1 C) (Oral)  Resp 18  Ht  (1.575 m)  Wt 68.04 kg  BMI 27.43 kg/m2  SpO2 100%  LMP 01/29/2015 Physical Exam  Constitutional: She is oriented to person, place, and time. She appears well-developed and well-nourished. No distress.  HENT:  Head: Normocephalic and atraumatic.  Right Ear: External ear normal.  Left Ear: External ear normal.  Nose: Nose normal.  Mouth/Throat: Uvula is midline, oropharynx is clear and moist and mucous membranes are normal.  Eyes: Conjunctivae, EOM and lids are normal. Pupils are equal, round, and reactive to light. Right eye exhibits no discharge. Left eye exhibits no discharge. No scleral icterus.  Neck: Normal range of motion. Neck supple.  Cardiovascular: Normal  rate, regular rhythm, normal heart sounds, intact distal pulses and normal pulses.   Pulmonary/Chest: Effort normal and breath sounds normal. No respiratory distress. She has no wheezes. She has no rales.  Abdominal: Soft. Normal appearance and bowel sounds are normal. She exhibits no distension and no mass. There is tenderness. There is rebound. There is no rigidity and no guarding.  Mild TTP in RLQ.  Genitourinary: Uterus is not enlarged and not  tender. Cervix exhibits no motion tenderness, no discharge and no friability. Right adnexum displays no mass, no tenderness and no fullness. Left adnexum displays no mass, no tenderness and no fullness. No erythema, tenderness or bleeding in the vagina. No foreign body around the vagina. No signs of injury around the vagina. Vaginal discharge found.  Small amount of brown discharge present in vaginal vault.  Musculoskeletal: Normal range of motion. She exhibits no edema or tenderness.  Neurological: She is alert and oriented to person, place, and time. She has normal strength. No sensory deficit.  Skin: Skin is warm, dry and intact. No rash noted. She is not diaphoretic. No erythema. No pallor.  Psychiatric: She has a normal mood and affect. Her speech is normal and behavior is normal.  Nursing note and vitals reviewed.   ED Course  Procedures (including critical care time)  Labs Review Labs Reviewed  WET PREP, GENITAL - Abnormal; Notable for the following:    WBC, Wet Prep HPF POC MODERATE (*)    All other components within normal limits  URINALYSIS, ROUTINE W REFLEX MICROSCOPIC (NOT AT [facility]) - Abnormal; Notable for the following:    Hgb urine dipstick SMALL (*)    Protein, ur 100 (*)    All other components within normal limits  URINE MICROSCOPIC-ADD ON - Abnormal; Notable for the following:    Squamous Epithelial / LPF 0-5 (*)    All other components within normal limits  LIPASE, BLOOD  COMPREHENSIVE METABOLIC PANEL  CBC  I-STAT BETA HCG BLOOD, ED (MC, WL, AP ONLY)  GC/CHLAMYDIA PROBE AMP ([place]) NOT AT [facility]    Imaging Review No results found.   I have personally reviewed and evaluated these images and lab results as part of my medical decision-making.   EKG Interpretation None      MDM   Final diagnoses:  Abdominal pain, unspecified abdominal location  Nausea    24 year old female presents with lower abdominal pain and nausea for the past 2 days. Reports  fever at home.  Denies vomiting, diarrhea, constipation, dysuria, urgency, frequency, vaginal discharge.   Patient is afebrile. Vital signs stable. Heart regular rate and rhythm. Lungs clear to auscultation bilaterally. Mild tenderness to palpation of right lower quadrant with guarding. No rebound or masses. Small amount of brown discharge present in vaginal vault. No cervical motion tenderness or cervical friability. No adnexal or uterine tenderness.  Patient given percocet for pain per protocol while waiting to be evaluated.  CBC negative for leukocytosis or anemia. CMP unremarkable. Lipase within normal limits. Beta hCG negative. UA negative for infection. Wet prep remarkable for moderate WBC. Do not feel treatment for STD is indicated at this time given patient denies vaginal discharge.  Patient discussed with and seen by Dr. Ranae Palms. Will obtain CT abdomen pelvis for further evaluation of patient's symptoms. Patient signed out to Earley Favor, NP at shift change. CT abdomen pelvis pending. If negative, feel patient is stable for discharge home with PCP follow-up.  BP 120/84 mmHg  Pulse 60  Temp(Src) 98.7 F (  37.1 C) (Oral)  Resp 18  Ht  (1.575 m)  Wt 68.04 kg  BMI 27.43 kg/m2  SpO2 100%  LMP 01/29/2015     Mady Gemma, PA-C 02/02/15 2036  Loren Racer, MD 02/02/15 2221

## 2015-02-02 NOTE — ED Notes (Signed)
Pt here for lower abd pain, more on the right side with nausea. sts also she is very thirsty and urinating a lot.

## 2015-02-02 NOTE — ED Notes (Signed)
Contrast given to patient

## 2015-02-02 NOTE — ED Notes (Signed)
Pt to Ultrasound at this time.

## 2015-02-03 LAB — GC/CHLAMYDIA PROBE AMP (~~LOC~~) NOT AT ARMC
Chlamydia: NEGATIVE
NEISSERIA GONORRHEA: NEGATIVE

## 2015-02-03 MED ORDER — IBUPROFEN 600 MG PO TABS: 600.0000 mg | ORAL_TABLET | Freq: Four times a day (QID) | ORAL | Status: DC | PRN

## 2015-02-03 MED ORDER — OXYCODONE-ACETAMINOPHEN 5-325 MG PO TABS: 1.0000 | ORAL_TABLET | Freq: Four times a day (QID) | ORAL | Status: DC | PRN

## 2015-02-03 NOTE — Discharge Instructions (Signed)
1. Medications: usual home medications 2. Treatment: rest, drink plenty of fluids, 3. Follow Up: please followup with your primary doctor this week for discussion of your diagnoses and further evaluation after today's visit; if you do not have a primary care doctor use the resource guide provided to find one; please return to the ER for high fever, severe pain, vomiting, new or worsening symptoms   Abdominal Pain, Adult Many things can cause abdominal pain. Usually, abdominal pain is not caused by a disease and will improve without treatment. It can often be observed and treated at home. Your health care provider will do a physical exam and possibly order blood tests and X-rays to help determine the seriousness of your pain. However, in many cases, more time must pass before a clear cause of the pain can be found. Before that point, your health care provider may not know if you need more testing or further treatment. HOME CARE INSTRUCTIONS Monitor your abdominal pain for any changes. The following actions may help to alleviate any discomfort you are experiencing:  Only take over-the-counter or prescription medicines as directed by your health care provider.  Do not take laxatives unless directed to do so by your health care provider.  Try a clear liquid diet (broth, tea, or water) as directed by your health care provider. Slowly move to a bland diet as tolerated. SEEK MEDICAL CARE IF:  You have unexplained abdominal pain.  You have abdominal pain associated with nausea or diarrhea.  You have pain when you urinate or have a bowel movement.  You experience abdominal pain that wakes you in the night.  You have abdominal pain that is worsened or improved by eating food.  You have abdominal pain that is worsened with eating fatty foods.  You have a fever. SEEK IMMEDIATE MEDICAL CARE IF:  Your pain does not go away within 2 hours.  You keep throwing up (vomiting).  Your pain is felt  only in portions of the abdomen, such as the right side or the left lower portion of the abdomen.  You pass bloody or black tarry stools. MAKE SURE YOU:  Understand these instructions.  Will watch your condition.  Will get help right away if you are not doing well or get worse.   This information is not intended to replace advice given to you by your health care provider. Make sure you discuss any questions you have with your health care provider.   Document Released: 11/24/2004 Document Revised: 11/05/2014 Document Reviewed: 10/24/2012 Elsevier Interactive Patient Education 2016 [employer].   Emergency Department Resource Guide 1) Find a Doctor and Pay Out of Pocket Although you won't have to find out who is covered by your insurance plan, it is a good idea to ask around and get recommendations. You will then need to call the office and see if the doctor you have chosen will accept you as a new patient and what types of options they offer for patients who are self-pay. Some doctors offer discounts or will set up payment plans for their patients who do not have insurance, but you will need to ask so you aren't surprised when you get to your appointment.  2) Contact Your Local Health Department Not all health departments have doctors that can see patients for sick visits, but many do, so it is worth a call to see if yours does. If you don't know where your local health department is, you can check in your phone book. The  CDC also has a tool to help you locate your state's health department, and many state websites also have listings of all of their local health departments.  3) Find a Walk-in Clinic If your illness is not likely to be very severe or complicated, you may want to try a walk in clinic. These are popping up all over the country in pharmacies, drugstores, and shopping centers. They're usually staffed by nurse practitioners or physician assistants that have been trained to treat  common illnesses and complaints. They're usually fairly quick and inexpensive. However, if you have serious medical issues or chronic medical problems, these are probably not your best option.  No Primary Care Doctor: - Call Health Connect at  [phone] - they can help you locate a primary care doctor that  accepts your insurance, provides certain services, etc. - Physician Referral Service- [phone]  Chronic Pain Problems: Organization         Address  Phone   Notes  [facility]  [phone] Patients need to be referred by their primary care doctor.   Medication Assistance: Organization         Address  Phone   Notes  [facility] Medication [facility] [address] [place] [place], [place] [address] [phone] --Must be a resident of [facility] -- Must have NO insurance coverage whatsoever (no Medicaid/ Medicare, etc.) -- The pt. MUST have a primary care doctor that directs their care regularly and follows them in the community   Med[facility]ist  [phone]   [employer]  [phone]    Agencies that provide inexpensive medical care: Organization         Address  Phone   Notes  [facility]  [phone]   [facility]    [phone]   [facility] [address] [place], [place] [address] [phone]   [facility] [address] [place] [place] [phone]   Planned Parenthood    [phone]   [facility]    [phone]   [facility]  [address], [place] Phone:  [phone], Fax:  [phone] Hours of Operation:  9 am - 6 pm, M-F.  Also accepts Medicaid/Medicare and self-pay.  [facility]  [address], [place] Phone: [phone], Fax: [phone]. Hours of Operation:  8:30 am - 5:30 pm, M-F.  Also accepts Medicaid and self-pay.  [facility] [place] [address], [place] Phone: [phone]   [facility] [address] Natasha Bence [place], [place] [phone], Ext. [phone] Mondays & Thursdays: 7-9 AM.  First 15 patients are seen on a first come, first serve basis.    Medicaid-accepting [facility] Providers:  Organization         Address  Phone   Notes  [facility] [address],  [phone] Also accepts self-pay patients.  [facility] [address] Laurell Josephs [place], [place]  [phone]   [facility] [address], [place] [phone]   [facility] [address] [place] [phone]   Renaye Rakers [address], [place]   [phone] Only accepts [place] Access [place] patients after they have their name applied to their card.   Self-Pay (no  insurance) in [place]:  Organization         Address  Phone   Notes  Sickle Cell Patients, [facility] [address] [place], [place] [phone]   [facility] [address] [place], [place] [phone]   [facility] [place]  [address], [place] [phone]   [facility]/Dr. Osei-Bonsu  [address] [place] or [address], [place] [phone] Phone number for both [place] and [place] locations is the same.  [facility] [address], [place] [phone]   [facility] [address] [place] or [address] [phone]   [facility] [address], [place] [phone], phone; [phone], fax Sees patients 1st and 3rd Saturday of every month.  Must not qualify for public or private insurance (i.e. Medicaid, Medicare, [place] Health Choice, Veterans' Benefits)  Household income should be no more than 200% of the  poverty level The clinic cannot treat you if you are pregnant or think you are pregnant  Sexually transmitted diseases are not treated at the clinic.    Dental Care: Organization         Address  Phone  Notes  [facility] Department [facility] [address] [place], [place] [phone] Accepts children up to age 18 who are enrolled in [place] or [place] Health Choice; pregnant women with a Medicaid card; and children who have applied for Medicaid or [place] Health Choice, but were declined, whose parents can pay a reduced fee at time of service.  [facility]  [address], [place] [phone] Accepts children up to age 68 who are enrolled in [place] or [place] Health Choice; pregnant women with a Medicaid card; and children who have applied for Medicaid or  Health Choice, but were declined, whose parents can pay a reduced fee at time of service.  [facility]  [address] [place], [place] [phone] Patients are seen by appointment only. Walk-ins are not accepted. [facility] will see patients 23 years of age and older. Monday - Tuesday (8am-5pm) Most Wednesdays (8:30-5pm) $30 per visit, cash only  [facility] Adult [facility] Access [facility]  [address], [facility] [phone] Patients are seen by appointment only. Walk-ins are not accepted. [facility] will see patients 62 years of age and older. One Wednesday Evening (Monthly: Volunteer Based).  $30 per visit, cash only  [employer]  [phone] for adults; Children under age 48, call Graduate Pediatric Dentistry at [phone]. Children aged 67-14, please call [phone] to request a pediatric application.  Dental services are provided in all areas of dental care including fillings, crowns and bridges, complete and partial dentures, implants, gum treatment, root canals, and extractions.  Preventive care is also provided. Treatment is provided to both adults and children. Patients are selected via a lottery and there is often a waiting list.   [facility] [address] [place]  [phone] [email or web address]   [facility] [address] [place], [place] [phone], Ext. [phone] Second and Fourth Thursday of each month, opens at 6:30 AM; Clinic ends at 9 AM.  Patients are seen on a first-come first-served basis, and a limited number are seen  during each clinic.   [facility]  [address] Griff[address] [place], [place] [phone]   Eligibility Requirements You must have lived in [place], [place], or [place] counties for at least the last three months.   You cannot be eligible for state or federal sponsored [place], including [employer], [place], or [employer].   You generally cannot be eligible for healthcare insurance through your employer.    How to apply: Eligibility screenings are held every Tuesday and Wednesday afternoon from 1:00 pm until 4:00 pm. You do not need an appointment for the interview!  [facility] [address] [place], [place] [phone]   [facility] Health Department  [phone]   [facility] Health Department  [phone]   [facility] Health Department  [phone]    Behavioral Health Resources in the Community: Intensive Outpatient Programs Organization         Address  Phone  Notes  [facility] Services [address] [place], [place] [phone]   [facility] [address] [place], [place] [phone]   [facility] [address], [place], [place]  [phone]   [facility] Mental Health [address],  [place], [place] [phone] or [phone]   Substance Abuse Resources Organization         Address  Phone  Notes  Alcohol [facility] Drug Services  [phone]   Addiction  [facility]  [phone]   [facility] [place]  [phone]   Floydene Flock  [phone]   Residential & Outpatient Substance Abuse Program  [phone]   Psychological Services Organization         Address  Phone  Notes  [facility]  [phone]   [facility] Services  [phone]   [facility] Mental Health [address] [place] [phone] or [phone]    Mobile Crisis Teams Organization         Address  Phone  Notes  [facility], Mobile Crisis Care Unit  [phone]   [facility]  [address] [place], [place] [phone]   Doristine Locks [address] [place] [phone]    Self-Help/Support Groups Organization         Address  Phone             Notes  [facility] [place] - variety of support groups  336- I7437963 Call for more information  Narcotics Anonymous (NA), [facility] [address], [employer] [place]  2 meetings at this location   Statistician         Address  Phone  Notes  [facility] [address],    [place]  [phone]   [facility]  [address] [place] [address], [place], [place] [phone]   [facility] [address] [place], [place] [phone] Admissions: 8am-3pm M-F  [facility] [address]    [place], [place] [phone]   [facility] [address] Starling Manns [place], [place] [phone]   [facility] [address]  [place], [place] [phone]   [facility] - Intensive Outpatient [address] Laurell Josephs [address], [place], [place] [phone]   [facility] [address] [place]  [place], [place] [phone] or [phone]   [facility] ([facility]) [address] [place], [place] [phone] Accepts Medicaid  [facility] [address]  [place]  [place]  [phone] Substance Abuse/Addiction Treatment   [facility] Organization         Address  Phone  Notes  [facility]  [phone]   Angie Fava, PhD [address] Ervin Knack [place], [place]   2493851541 or [phone]   [facility] Behavioral   [address] [place], [place] [phone]   [facility] Recovery [address] [place], [place] [phone] Insurance/Medicaid/sponsorship through [facility] [address]                                    [place], [place] [phone] Therapy/tele-psych/case  [facility] [address], [place] [phone]    Dr. Lolly Mustache  [phone]   [facility] [place]  [employer] [facility] Dept. 1) [address] [place] 2) [address], [place] 3)  [address], [place] [phone]  [phone]   [facility] Child Abuse Hotline 281-669-0889 or [phone] (After Hours)     Your ultrasound is normal

## 2015-02-03 NOTE — ED Notes (Signed)
Pt requesting something to drink 

## 2015-02-03 NOTE — ED Provider Notes (Signed)
CT scan had questionable finding.  Recommended ultrasound.  Ultrasound was reviewed.  No all within normal parameters.  This was discussed with patient.  She is in agreement to go home.  She's been given a prescription for Motrin that she can take on a regular basis as well as Percocet if she needs for or severe pain.  She's also been given a resource list to help her find a primary care physician at the time of discharge, she is feeling significantly better.  She has eaten without any discomfort  Earley Favor, NP 02/03/15 0050  Loren Racer, MD 02/06/15 [phone]

## 2017-05-10 DIAGNOSIS — M62838 Other muscle spasm: Secondary | ICD-10-CM | POA: Diagnosis not present

## 2017-05-10 DIAGNOSIS — M542 Cervicalgia: Secondary | ICD-10-CM | POA: Diagnosis not present

## 2017-05-30 DIAGNOSIS — F411 Generalized anxiety disorder: Secondary | ICD-10-CM | POA: Diagnosis not present

## 2017-05-31 DIAGNOSIS — Z3009 Encounter for other general counseling and advice on contraception: Secondary | ICD-10-CM | POA: Diagnosis not present

## 2017-05-31 DIAGNOSIS — J069 Acute upper respiratory infection, unspecified: Secondary | ICD-10-CM | POA: Diagnosis not present

## 2017-05-31 DIAGNOSIS — Z713 Dietary counseling and surveillance: Secondary | ICD-10-CM | POA: Diagnosis not present

## 2017-05-31 DIAGNOSIS — J029 Acute pharyngitis, unspecified: Secondary | ICD-10-CM | POA: Diagnosis not present

## 2017-06-05 DIAGNOSIS — F411 Generalized anxiety disorder: Secondary | ICD-10-CM | POA: Diagnosis not present

## 2017-06-05 DIAGNOSIS — F428 Other obsessive-compulsive disorder: Secondary | ICD-10-CM | POA: Diagnosis not present

## 2017-06-05 DIAGNOSIS — F633 Trichotillomania: Secondary | ICD-10-CM | POA: Diagnosis not present

## 2017-06-26 DIAGNOSIS — F411 Generalized anxiety disorder: Secondary | ICD-10-CM | POA: Diagnosis not present

## 2017-07-10 DIAGNOSIS — L03119 Cellulitis of unspecified part of limb: Secondary | ICD-10-CM | POA: Diagnosis not present

## 2017-07-10 DIAGNOSIS — S60511A Abrasion of right hand, initial encounter: Secondary | ICD-10-CM | POA: Diagnosis not present

## 2017-07-13 DIAGNOSIS — F411 Generalized anxiety disorder: Secondary | ICD-10-CM | POA: Diagnosis not present

## 2017-07-13 DIAGNOSIS — S60511D Abrasion of right hand, subsequent encounter: Secondary | ICD-10-CM | POA: Diagnosis not present

## 2017-08-29 DIAGNOSIS — F633 Trichotillomania: Secondary | ICD-10-CM | POA: Diagnosis not present

## 2017-08-29 DIAGNOSIS — F428 Other obsessive-compulsive disorder: Secondary | ICD-10-CM | POA: Diagnosis not present

## 2017-08-29 DIAGNOSIS — F411 Generalized anxiety disorder: Secondary | ICD-10-CM | POA: Diagnosis not present

## 2017-11-08 DIAGNOSIS — F411 Generalized anxiety disorder: Secondary | ICD-10-CM | POA: Diagnosis not present

## 2017-11-21 DIAGNOSIS — F411 Generalized anxiety disorder: Secondary | ICD-10-CM | POA: Diagnosis not present

## 2017-12-08 DIAGNOSIS — Z23 Encounter for immunization: Secondary | ICD-10-CM | POA: Diagnosis not present

## 2017-12-11 DIAGNOSIS — J0100 Acute maxillary sinusitis, unspecified: Secondary | ICD-10-CM | POA: Diagnosis not present

## 2017-12-20 DIAGNOSIS — F411 Generalized anxiety disorder: Secondary | ICD-10-CM | POA: Diagnosis not present

## 2018-01-08 DIAGNOSIS — J209 Acute bronchitis, unspecified: Secondary | ICD-10-CM | POA: Diagnosis not present

## 2018-01-08 DIAGNOSIS — R0789 Other chest pain: Secondary | ICD-10-CM | POA: Diagnosis not present

## 2018-01-11 DIAGNOSIS — J0100 Acute maxillary sinusitis, unspecified: Secondary | ICD-10-CM | POA: Diagnosis not present

## 2018-01-16 DIAGNOSIS — F411 Generalized anxiety disorder: Secondary | ICD-10-CM | POA: Diagnosis not present

## 2018-02-09 DIAGNOSIS — F411 Generalized anxiety disorder: Secondary | ICD-10-CM | POA: Diagnosis not present

## 2018-02-27 DIAGNOSIS — F411 Generalized anxiety disorder: Secondary | ICD-10-CM | POA: Diagnosis not present

## 2018-03-20 DIAGNOSIS — F411 Generalized anxiety disorder: Secondary | ICD-10-CM | POA: Diagnosis not present

## 2018-04-21 DIAGNOSIS — R5383 Other fatigue: Secondary | ICD-10-CM | POA: Diagnosis not present

## 2018-04-21 DIAGNOSIS — R5381 Other malaise: Secondary | ICD-10-CM | POA: Diagnosis not present

## 2018-04-21 DIAGNOSIS — J329 Chronic sinusitis, unspecified: Secondary | ICD-10-CM | POA: Diagnosis not present

## 2018-04-21 DIAGNOSIS — R05 Cough: Secondary | ICD-10-CM | POA: Diagnosis not present

## 2018-07-20 DIAGNOSIS — F411 Generalized anxiety disorder: Secondary | ICD-10-CM | POA: Diagnosis not present

## 2018-07-26 DIAGNOSIS — F411 Generalized anxiety disorder: Secondary | ICD-10-CM | POA: Diagnosis not present

## 2018-08-01 DIAGNOSIS — J0100 Acute maxillary sinusitis, unspecified: Secondary | ICD-10-CM | POA: Diagnosis not present

## 2018-08-10 DIAGNOSIS — F411 Generalized anxiety disorder: Secondary | ICD-10-CM | POA: Diagnosis not present

## 2018-08-28 DIAGNOSIS — F411 Generalized anxiety disorder: Secondary | ICD-10-CM | POA: Diagnosis not present

## 2018-09-06 DIAGNOSIS — Z87891 Personal history of nicotine dependence: Secondary | ICD-10-CM | POA: Diagnosis not present

## 2018-09-06 DIAGNOSIS — Z13220 Encounter for screening for lipoid disorders: Secondary | ICD-10-CM | POA: Diagnosis not present

## 2018-09-06 DIAGNOSIS — R002 Palpitations: Secondary | ICD-10-CM | POA: Diagnosis not present

## 2018-09-06 DIAGNOSIS — Z6831 Body mass index (BMI) 31.0-31.9, adult: Secondary | ICD-10-CM | POA: Diagnosis not present

## 2018-09-06 DIAGNOSIS — Z0000 Encounter for general adult medical examination without abnormal findings: Secondary | ICD-10-CM | POA: Diagnosis not present

## 2018-09-06 DIAGNOSIS — F419 Anxiety disorder, unspecified: Secondary | ICD-10-CM | POA: Diagnosis not present

## 2018-09-20 DIAGNOSIS — F411 Generalized anxiety disorder: Secondary | ICD-10-CM | POA: Diagnosis not present

## 2018-10-09 DIAGNOSIS — F411 Generalized anxiety disorder: Secondary | ICD-10-CM | POA: Diagnosis not present

## 2018-11-07 DIAGNOSIS — F411 Generalized anxiety disorder: Secondary | ICD-10-CM | POA: Diagnosis not present

## 2018-11-21 DIAGNOSIS — F411 Generalized anxiety disorder: Secondary | ICD-10-CM | POA: Diagnosis not present

## 2018-12-12 DIAGNOSIS — F411 Generalized anxiety disorder: Secondary | ICD-10-CM | POA: Diagnosis not present

## 2019-01-22 DIAGNOSIS — F411 Generalized anxiety disorder: Secondary | ICD-10-CM | POA: Diagnosis not present

## 2019-04-24 DIAGNOSIS — Z01419 Encounter for gynecological examination (general) (routine) without abnormal findings: Secondary | ICD-10-CM | POA: Diagnosis not present

## 2019-04-24 DIAGNOSIS — E785 Hyperlipidemia, unspecified: Secondary | ICD-10-CM | POA: Diagnosis present

## 2019-04-24 DIAGNOSIS — N921 Excessive and frequent menstruation with irregular cycle: Secondary | ICD-10-CM | POA: Diagnosis not present

## 2019-04-24 DIAGNOSIS — Z6829 Body mass index (BMI) 29.0-29.9, adult: Secondary | ICD-10-CM | POA: Diagnosis not present

## 2019-07-03 DIAGNOSIS — R112 Nausea with vomiting, unspecified: Secondary | ICD-10-CM | POA: Diagnosis not present

## 2019-07-03 DIAGNOSIS — G43111 Migraine with aura, intractable, with status migrainosus: Secondary | ICD-10-CM | POA: Diagnosis not present

## 2019-08-09 DIAGNOSIS — F411 Generalized anxiety disorder: Secondary | ICD-10-CM | POA: Diagnosis not present

## 2019-08-16 DIAGNOSIS — F411 Generalized anxiety disorder: Secondary | ICD-10-CM | POA: Diagnosis not present

## 2019-08-23 DIAGNOSIS — F411 Generalized anxiety disorder: Secondary | ICD-10-CM | POA: Diagnosis not present

## 2019-09-06 DIAGNOSIS — F411 Generalized anxiety disorder: Secondary | ICD-10-CM | POA: Diagnosis not present

## 2019-09-09 DIAGNOSIS — E782 Mixed hyperlipidemia: Secondary | ICD-10-CM | POA: Diagnosis not present

## 2019-09-09 DIAGNOSIS — M25511 Pain in right shoulder: Secondary | ICD-10-CM | POA: Diagnosis not present

## 2019-09-09 DIAGNOSIS — F419 Anxiety disorder, unspecified: Secondary | ICD-10-CM | POA: Diagnosis not present

## 2019-09-09 DIAGNOSIS — Z0000 Encounter for general adult medical examination without abnormal findings: Secondary | ICD-10-CM | POA: Diagnosis not present

## 2019-09-13 DIAGNOSIS — F411 Generalized anxiety disorder: Secondary | ICD-10-CM | POA: Diagnosis not present

## 2019-09-20 DIAGNOSIS — Z20822 Contact with and (suspected) exposure to covid-19: Secondary | ICD-10-CM | POA: Diagnosis not present

## 2019-10-04 DIAGNOSIS — F411 Generalized anxiety disorder: Secondary | ICD-10-CM | POA: Diagnosis not present

## 2019-10-11 DIAGNOSIS — F411 Generalized anxiety disorder: Secondary | ICD-10-CM | POA: Diagnosis not present

## 2019-10-18 DIAGNOSIS — F411 Generalized anxiety disorder: Secondary | ICD-10-CM | POA: Diagnosis not present

## 2019-11-01 DIAGNOSIS — F411 Generalized anxiety disorder: Secondary | ICD-10-CM | POA: Diagnosis not present

## 2019-11-08 DIAGNOSIS — F411 Generalized anxiety disorder: Secondary | ICD-10-CM | POA: Diagnosis not present

## 2019-11-15 DIAGNOSIS — F411 Generalized anxiety disorder: Secondary | ICD-10-CM | POA: Diagnosis not present

## 2019-12-06 DIAGNOSIS — F411 Generalized anxiety disorder: Secondary | ICD-10-CM | POA: Diagnosis not present

## 2019-12-13 DIAGNOSIS — F411 Generalized anxiety disorder: Secondary | ICD-10-CM | POA: Diagnosis not present

## 2019-12-27 DIAGNOSIS — F411 Generalized anxiety disorder: Secondary | ICD-10-CM | POA: Diagnosis not present

## 2020-01-03 DIAGNOSIS — F411 Generalized anxiety disorder: Secondary | ICD-10-CM | POA: Diagnosis not present

## 2020-01-10 DIAGNOSIS — F411 Generalized anxiety disorder: Secondary | ICD-10-CM | POA: Diagnosis not present

## 2020-01-31 DIAGNOSIS — F411 Generalized anxiety disorder: Secondary | ICD-10-CM | POA: Diagnosis not present

## 2020-02-07 DIAGNOSIS — F411 Generalized anxiety disorder: Secondary | ICD-10-CM | POA: Diagnosis not present

## 2020-02-27 DIAGNOSIS — G43111 Migraine with aura, intractable, with status migrainosus: Secondary | ICD-10-CM | POA: Diagnosis not present

## 2020-02-28 DIAGNOSIS — F411 Generalized anxiety disorder: Secondary | ICD-10-CM | POA: Diagnosis not present

## 2020-03-06 DIAGNOSIS — R69 Illness, unspecified: Secondary | ICD-10-CM | POA: Diagnosis not present

## 2020-03-20 DIAGNOSIS — R69 Illness, unspecified: Secondary | ICD-10-CM | POA: Diagnosis not present

## 2020-03-27 DIAGNOSIS — R69 Illness, unspecified: Secondary | ICD-10-CM | POA: Diagnosis not present

## 2020-04-03 DIAGNOSIS — R69 Illness, unspecified: Secondary | ICD-10-CM | POA: Diagnosis not present

## 2020-04-17 DIAGNOSIS — R69 Illness, unspecified: Secondary | ICD-10-CM | POA: Diagnosis not present

## 2020-04-24 DIAGNOSIS — R69 Illness, unspecified: Secondary | ICD-10-CM | POA: Diagnosis not present

## 2020-05-01 DIAGNOSIS — R69 Illness, unspecified: Secondary | ICD-10-CM | POA: Diagnosis not present

## 2020-05-04 DIAGNOSIS — G43111 Migraine with aura, intractable, with status migrainosus: Secondary | ICD-10-CM | POA: Diagnosis not present

## 2020-05-22 DIAGNOSIS — R69 Illness, unspecified: Secondary | ICD-10-CM | POA: Diagnosis not present

## 2020-05-28 DIAGNOSIS — Z01419 Encounter for gynecological examination (general) (routine) without abnormal findings: Secondary | ICD-10-CM | POA: Diagnosis not present

## 2020-05-28 DIAGNOSIS — Z3040 Encounter for surveillance of contraceptives, unspecified: Secondary | ICD-10-CM | POA: Diagnosis not present

## 2020-05-28 DIAGNOSIS — Z6830 Body mass index (BMI) 30.0-30.9, adult: Secondary | ICD-10-CM | POA: Diagnosis not present

## 2020-05-29 DIAGNOSIS — R69 Illness, unspecified: Secondary | ICD-10-CM | POA: Diagnosis not present

## 2020-06-12 DIAGNOSIS — R69 Illness, unspecified: Secondary | ICD-10-CM | POA: Diagnosis not present

## 2020-06-19 DIAGNOSIS — R69 Illness, unspecified: Secondary | ICD-10-CM | POA: Diagnosis not present

## 2020-06-26 DIAGNOSIS — R69 Illness, unspecified: Secondary | ICD-10-CM | POA: Diagnosis not present

## 2020-07-10 DIAGNOSIS — R69 Illness, unspecified: Secondary | ICD-10-CM | POA: Diagnosis not present

## 2020-07-17 DIAGNOSIS — R69 Illness, unspecified: Secondary | ICD-10-CM | POA: Diagnosis not present

## 2020-07-24 DIAGNOSIS — R69 Illness, unspecified: Secondary | ICD-10-CM | POA: Diagnosis not present

## 2020-07-28 DIAGNOSIS — M2550 Pain in unspecified joint: Secondary | ICD-10-CM | POA: Diagnosis not present

## 2020-07-28 DIAGNOSIS — R899 Unspecified abnormal finding in specimens from other organs, systems and tissues: Secondary | ICD-10-CM | POA: Diagnosis not present

## 2020-07-28 DIAGNOSIS — R5383 Other fatigue: Secondary | ICD-10-CM | POA: Diagnosis not present

## 2020-08-07 DIAGNOSIS — R69 Illness, unspecified: Secondary | ICD-10-CM | POA: Diagnosis not present

## 2020-08-14 DIAGNOSIS — R69 Illness, unspecified: Secondary | ICD-10-CM | POA: Diagnosis not present

## 2020-08-21 DIAGNOSIS — R69 Illness, unspecified: Secondary | ICD-10-CM | POA: Diagnosis not present

## 2020-08-26 DIAGNOSIS — G43109 Migraine with aura, not intractable, without status migrainosus: Secondary | ICD-10-CM | POA: Diagnosis not present

## 2020-08-28 DIAGNOSIS — R69 Illness, unspecified: Secondary | ICD-10-CM | POA: Diagnosis not present

## 2020-08-29 DIAGNOSIS — H538 Other visual disturbances: Secondary | ICD-10-CM | POA: Diagnosis not present

## 2020-08-29 DIAGNOSIS — T22211A Burn of second degree of right forearm, initial encounter: Secondary | ICD-10-CM | POA: Diagnosis not present

## 2020-08-29 DIAGNOSIS — L539 Erythematous condition, unspecified: Secondary | ICD-10-CM | POA: Diagnosis not present

## 2020-09-03 DIAGNOSIS — R519 Headache, unspecified: Secondary | ICD-10-CM | POA: Diagnosis not present

## 2020-09-03 DIAGNOSIS — R200 Anesthesia of skin: Secondary | ICD-10-CM | POA: Diagnosis not present

## 2020-09-03 DIAGNOSIS — H5711 Ocular pain, right eye: Secondary | ICD-10-CM | POA: Diagnosis not present

## 2020-09-03 DIAGNOSIS — H539 Unspecified visual disturbance: Secondary | ICD-10-CM | POA: Diagnosis not present

## 2020-09-03 DIAGNOSIS — R202 Paresthesia of skin: Secondary | ICD-10-CM | POA: Diagnosis not present

## 2020-09-03 DIAGNOSIS — G35 Multiple sclerosis: Secondary | ICD-10-CM | POA: Diagnosis not present

## 2020-09-04 DIAGNOSIS — R69 Illness, unspecified: Secondary | ICD-10-CM | POA: Diagnosis not present

## 2020-09-11 DIAGNOSIS — R69 Illness, unspecified: Secondary | ICD-10-CM | POA: Diagnosis not present

## 2020-09-16 ENCOUNTER — Inpatient Hospital Stay
Admission: EM | Admit: 2020-09-16 | Discharge: 2020-09-21 | DRG: 058 | Disposition: A | Discharge status: Home / Self Care | Payer: 59 | Attending: Internal Medicine | Admitting: Internal Medicine

## 2020-09-16 ENCOUNTER — Other Ambulatory Visit: Payer: Self-pay

## 2020-09-16 ENCOUNTER — Encounter: Payer: Self-pay | Admitting: *Deleted

## 2020-09-16 ENCOUNTER — Emergency Department: Payer: 59

## 2020-09-16 DIAGNOSIS — M50223 Other cervical disc displacement at C6-C7 level: Secondary | ICD-10-CM | POA: Diagnosis not present

## 2020-09-16 DIAGNOSIS — K5900 Constipation, unspecified: Secondary | ICD-10-CM | POA: Diagnosis present

## 2020-09-16 DIAGNOSIS — G4700 Insomnia, unspecified: Secondary | ICD-10-CM | POA: Diagnosis present

## 2020-09-16 DIAGNOSIS — Z79899 Other long term (current) drug therapy: Secondary | ICD-10-CM

## 2020-09-16 DIAGNOSIS — K30 Functional dyspepsia: Secondary | ICD-10-CM | POA: Diagnosis present

## 2020-09-16 DIAGNOSIS — G08 Intracranial and intraspinal phlebitis and thrombophlebitis: Secondary | ICD-10-CM | POA: Diagnosis not present

## 2020-09-16 DIAGNOSIS — E785 Hyperlipidemia, unspecified: Secondary | ICD-10-CM | POA: Diagnosis present

## 2020-09-16 DIAGNOSIS — H469 Unspecified optic neuritis: Secondary | ICD-10-CM | POA: Diagnosis not present

## 2020-09-16 DIAGNOSIS — I676 Nonpyogenic thrombosis of intracranial venous system: Secondary | ICD-10-CM | POA: Diagnosis not present

## 2020-09-16 DIAGNOSIS — E669 Obesity, unspecified: Secondary | ICD-10-CM | POA: Diagnosis present

## 2020-09-16 DIAGNOSIS — G35 Multiple sclerosis: Principal | ICD-10-CM | POA: Diagnosis present

## 2020-09-16 DIAGNOSIS — M50221 Other cervical disc displacement at C4-C5 level: Secondary | ICD-10-CM | POA: Diagnosis not present

## 2020-09-16 DIAGNOSIS — Z87891 Personal history of nicotine dependence: Secondary | ICD-10-CM

## 2020-09-16 DIAGNOSIS — Z793 Long term (current) use of hormonal contraceptives: Secondary | ICD-10-CM | POA: Diagnosis not present

## 2020-09-16 DIAGNOSIS — K219 Gastro-esophageal reflux disease without esophagitis: Secondary | ICD-10-CM | POA: Diagnosis present

## 2020-09-16 DIAGNOSIS — G379 Demyelinating disease of central nervous system, unspecified: Secondary | ICD-10-CM | POA: Diagnosis not present

## 2020-09-16 DIAGNOSIS — Z6831 Body mass index (BMI) 31.0-31.9, adult: Secondary | ICD-10-CM

## 2020-09-16 DIAGNOSIS — Z20822 Contact with and (suspected) exposure to covid-19: Secondary | ICD-10-CM | POA: Diagnosis present

## 2020-09-16 DIAGNOSIS — M5030 Other cervical disc degeneration, unspecified cervical region: Secondary | ICD-10-CM | POA: Diagnosis not present

## 2020-09-16 DIAGNOSIS — R519 Headache, unspecified: Secondary | ICD-10-CM | POA: Diagnosis not present

## 2020-09-16 DIAGNOSIS — F32A Depression, unspecified: Secondary | ICD-10-CM | POA: Diagnosis present

## 2020-09-16 DIAGNOSIS — R69 Illness, unspecified: Secondary | ICD-10-CM | POA: Diagnosis not present

## 2020-09-16 DIAGNOSIS — G9389 Other specified disorders of brain: Secondary | ICD-10-CM | POA: Diagnosis not present

## 2020-09-16 LAB — CBC WITH DIFFERENTIAL/PLATELET
Abs Immature Granulocytes: 0.03 10*3/uL (ref 0.00–0.07)
Basophils Absolute: 0.1 10*3/uL (ref 0.0–0.1)
Basophils Relative: 1 %
Eosinophils Absolute: 0.1 10*3/uL (ref 0.0–0.5)
Eosinophils Relative: 1 %
HCT: 42.2 % (ref 36.0–46.0)
Hemoglobin: 14 g/dL (ref 12.0–15.0)
Immature Granulocytes: 0 %
Lymphocytes Relative: 26 %
Lymphs Abs: 2.5 10*3/uL (ref 0.7–4.0)
MCH: 27.6 pg (ref 26.0–34.0)
MCHC: 33.2 g/dL (ref 30.0–36.0)
MCV: 83.1 fL (ref 80.0–100.0)
Monocytes Absolute: 0.5 10*3/uL (ref 0.1–1.0)
Monocytes Relative: 5 %
Neutro Abs: 6.6 10*3/uL (ref 1.7–7.7)
Neutrophils Relative %: 67 %
Platelets: 213 10*3/uL (ref 150–400)
RBC: 5.08 MIL/uL (ref 3.87–5.11)
RDW: 13 % (ref 11.5–15.5)
WBC: 9.8 10*3/uL (ref 4.0–10.5)
nRBC: 0 % (ref 0.0–0.2)

## 2020-09-16 LAB — BASIC METABOLIC PANEL
Anion gap: 9 (ref 5–15)
BUN: 10 mg/dL (ref 6–20)
CO2: 24 mmol/L (ref 22–32)
Calcium: 9.4 mg/dL (ref 8.9–10.3)
Chloride: 104 mmol/L (ref 98–111)
Creatinine, Ser: 0.5 mg/dL (ref 0.44–1.00)
GFR, Estimated: >60 mL/min (ref >60)
Glucose, Bld: 92 mg/dL (ref 70–99)
Potassium: 3.7 mmol/L (ref 3.5–5.1)
Sodium: 137 mmol/L (ref 135–145)

## 2020-09-16 LAB — SEDIMENTATION RATE: Sed Rate: 18 mm/hr (ref 0–20)

## 2020-09-16 IMAGING — MR MR HEAD WO/W CM
13 series · 48 of 48 positions shown · IV contrast (gadavist)
Comparison: None.

CLINICAL DATA: Right-sided head pain. History of multiple sclerosis
Multiple sclerosis, new event

EXAM:
MRI HEAD WITHOUT AND WITH CONTRAST
TECHNIQUE: Multiplanar, multiecho pulse sequences of the brain and surrounding
structures were obtained without and with intravenous contrast.
CONTRAST:  7mL GADAVIST GADOBUTROL 1 MMOL/ML IV SOLN

[Series 5: ax dwi_tracew · axial · 3.0mm · 0.65mm/px · z∈[-98,+57]mm · 4 of 48 slices shown]
[im 1/48]
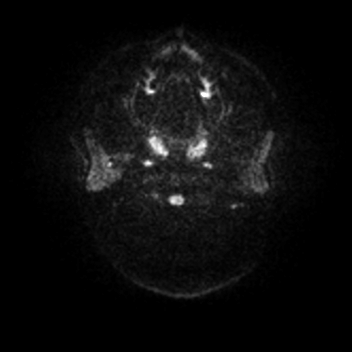
[im 16/48]
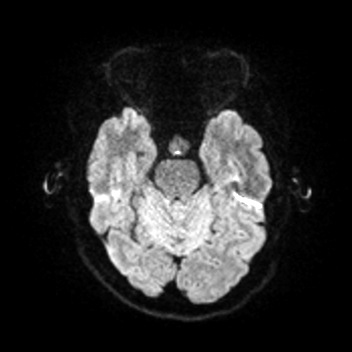
[im 32/48]
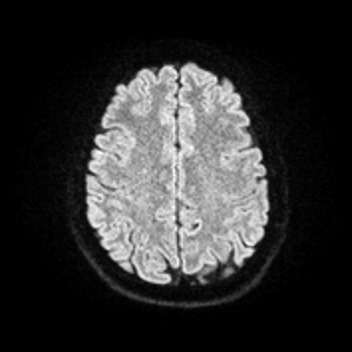
[im 48/48]
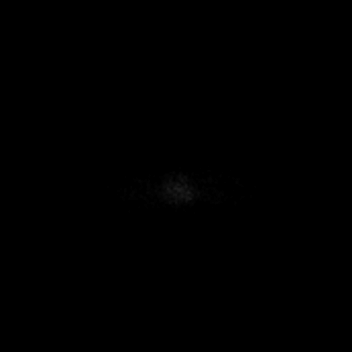

[Series 6: ax dwi_adc · axial · 3.0mm · 0.65mm/px · z∈[-98,+53]mm · 3 of 47 slices shown]
[im 1/47]
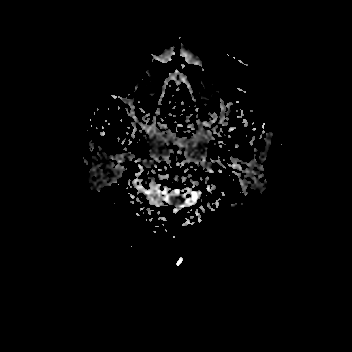
[im 24/47]
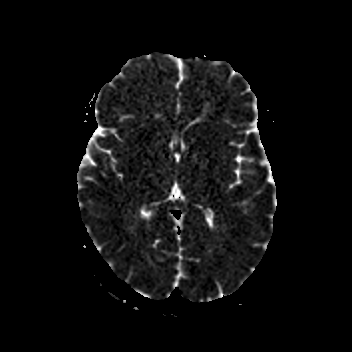
[im 47/47]
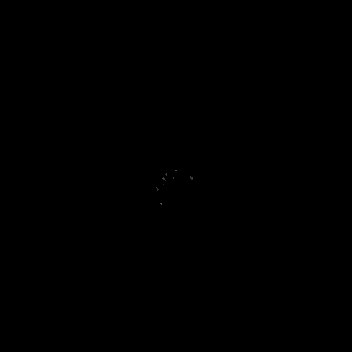

[Series 7: cor dwi_tracew · coronal · 5.0mm · 0.65mm/px · 2 of 40 slices shown]
[im 1/40]
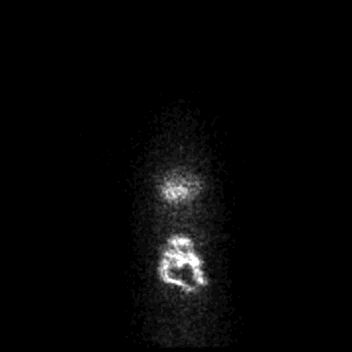
[im 40/40]
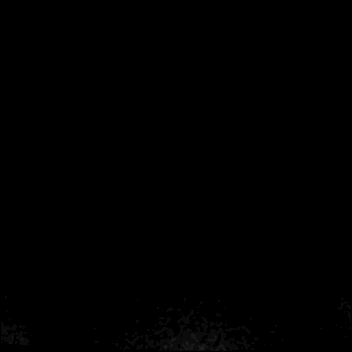

[Series 8: cor dwi_adc · coronal · 5.0mm · 0.65mm/px · 2 of 38 slices shown]
[im 1/38]
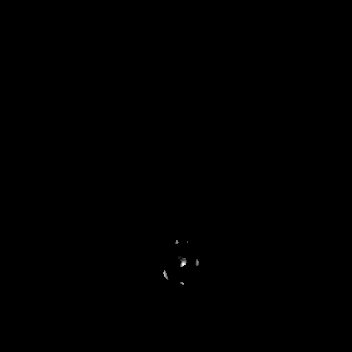
[im 38/38]
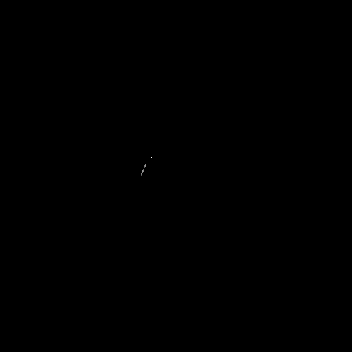

[Series 12: mag_images · axial · 3.0mm · 0.90mm/px · z∈[-109,+67]mm · 3 of 60 slices shown]
[im 1/60]
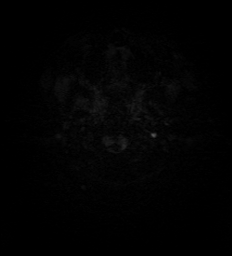
[im 30/60]
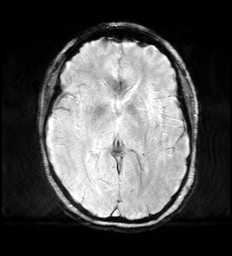
[im 60/60]
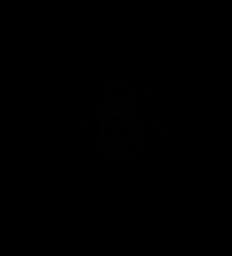

[Series 13: pha_images · axial · 3.0mm · 0.90mm/px · z∈[-109,+67]mm · 3 of 60 slices shown]
[im 1/60]
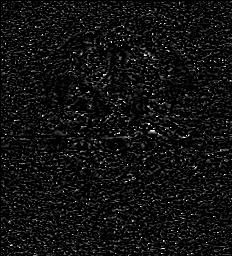
[im 30/60]
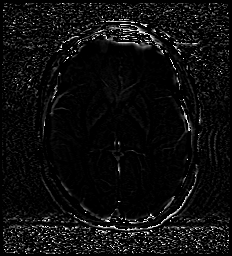
[im 60/60]
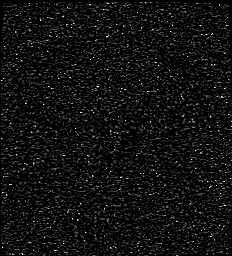

[Series 14: swi_images · axial · 3.0mm · 0.90mm/px · z∈[-109,+67]mm · 3 of 60 slices shown]
[im 1/60]
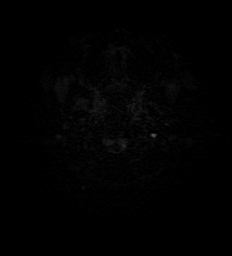
[im 30/60]
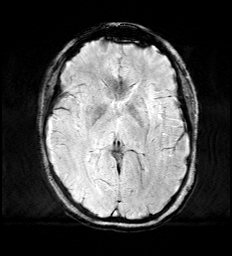
[im 60/60]
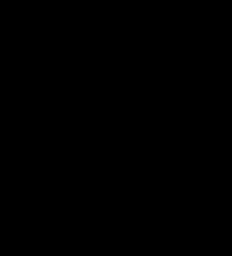

[Series 16: FLAIR · axial · 3.0mm · 0.53mm/px · z∈[-102,+60]mm · 3 of 55 slices shown]
[im 1/55]
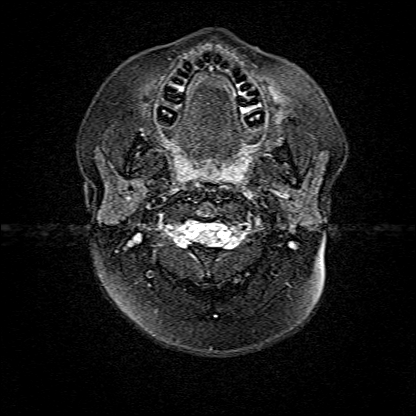
[im 28/55]
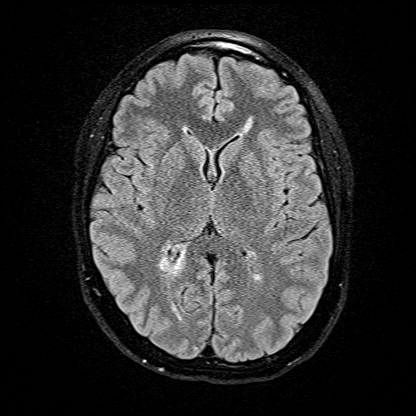
[im 55/55]
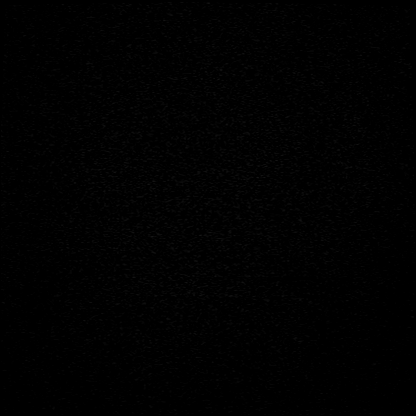

[Series 17: T1 · axial · 1.0mm · 0.98mm/px · z∈[-109,+66]mm · 10 of 176 slices shown]
[im 1/176]
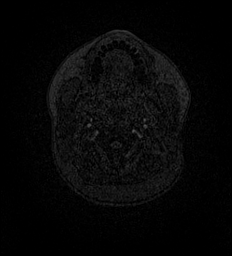
[im 20/176]
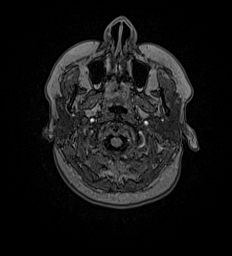
[im 39/176]
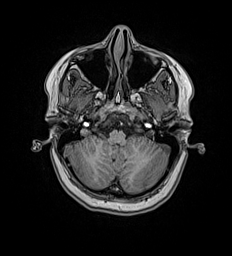
[im 59/176]
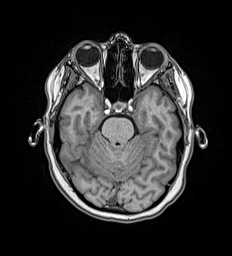
[im 78/176]
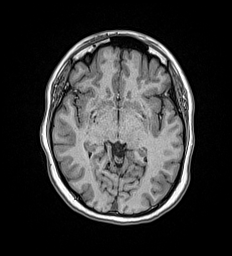
[im 98/176]
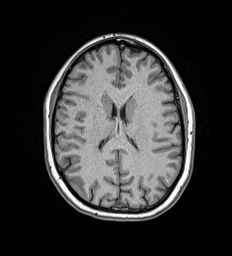
[im 117/176]
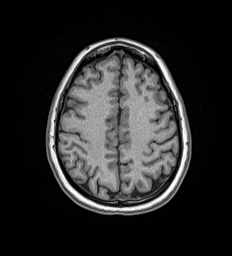
[im 137/176]
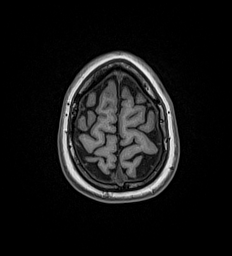
[im 156/176]
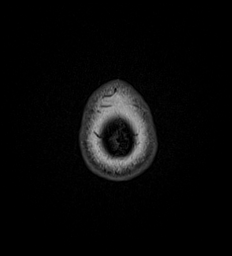
[im 176/176]
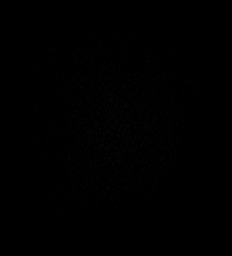

[Series 18: T2 · coronal · 5.0mm · 0.57mm/px · 2 of 29 slices shown]
[im 1/29]
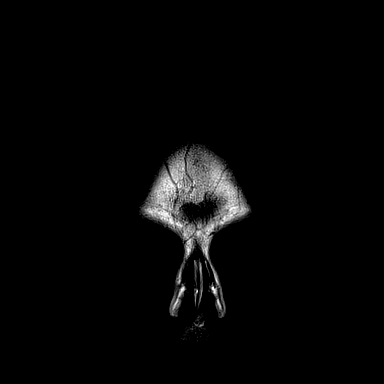
[im 29/29]
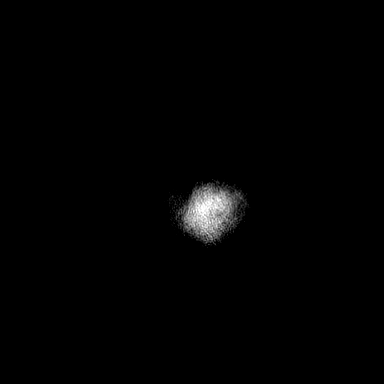

[Series 19: T1 post-contrast · axial · 1.0mm · 0.98mm/px · z∈[-109,+66]mm · 10 of 176 slices shown (1 of 3)]
[im 1/176]
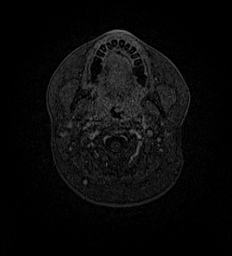
[im 20/176]
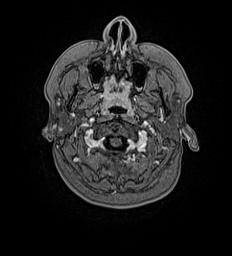
[im 39/176]
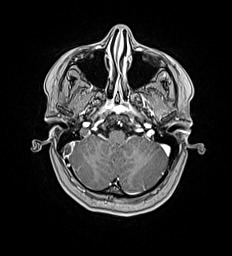
[im 59/176]
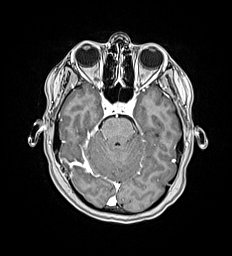
[im 78/176]
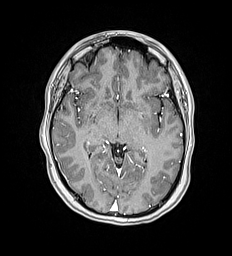
[im 98/176]
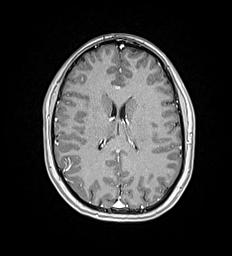
[im 117/176]
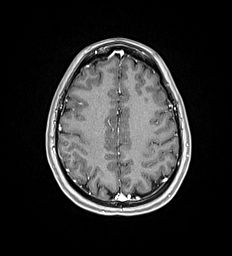
[im 137/176]
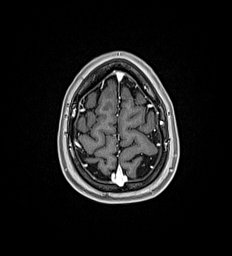
[im 156/176]
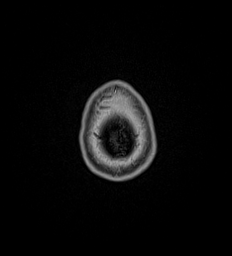
[im 176/176]
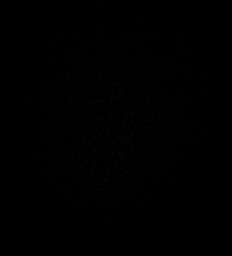

[Series 20: T1 post-contrast · coronal · 5.0mm · 0.57mm/px · 2 of 29 slices shown (2 of 3)]
[im 1/29]
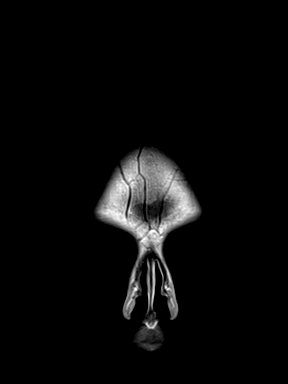
[im 29/29]
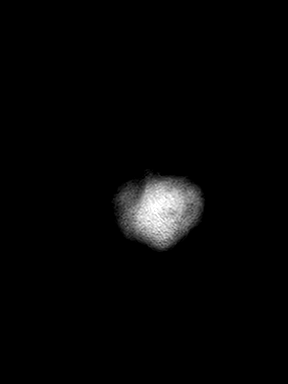

[Series 21: T1 post-contrast · sagittal · 5.0mm · 0.62mm/px · 1 of 24 slices shown (3 of 3)]
[im 1/24]
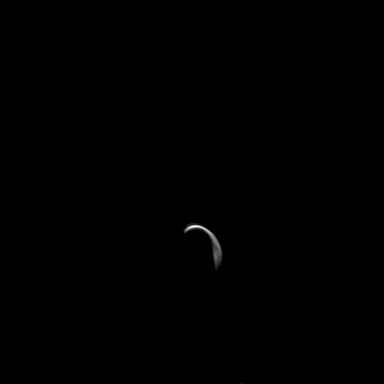

[48 of 48 positions shown; findings below may reference images not displayed]

FINDINGS: Brain: No acute infarct, mass effect or extra-axial collection.
There are 5-10 hyperintense T2-weighted signal lesions of the
periventricular white matter. There is a contrast-enhancing lesion
in the left frontal lobe ([DATE]). The midline structures are
normal.

Vascular: There is a large filling defect within the right
transverse sinus that extends into the sigmoid sinus and internal
jugular vein.

Skull and upper cervical spine: Normal calvarium and skull base.
Visualized upper cervical spine and soft tissues are normal.

Sinuses/Orbits:No paranasal sinus fluid levels or advanced mucosal
thickening. No mastoid or middle ear effusion. Normal orbits.
IMPRESSION: 1. Contrast-enhancing lesion in the left frontal lobe consistent
with active demyelination.
2. Thrombosis of the right transverse sinus, sigmoid sinus and
internal jugular vein.
3. Multiple chronic white matter lesions in a pattern consistent
multiple sclerosis

## 2020-09-16 MED ORDER — FENTANYL CITRATE (PF) 100 MCG/2ML IJ SOLN
100.0000 ug | Freq: Once | INTRAMUSCULAR | Status: AC
  Administered 2020-09-17: 100 ug via INTRAVENOUS
  Filled 2020-09-16: qty 2

## 2020-09-16 MED ORDER — GADOBUTROL 1 MMOL/ML IV SOLN
7.0000 mL | Freq: Once | INTRAVENOUS | Status: AC | PRN
  Administered 2020-09-16: 7 mL via INTRAVENOUS
  Filled 2020-09-16: qty 7.5

## 2020-09-16 MED ORDER — HEPARIN (PORCINE) 25000 UT/250ML-% IV SOLN
1050.0000 [IU]/h | INTRAVENOUS | Status: DC
  Administered 2020-09-17: 1050 [IU]/h via INTRAVENOUS
  Administered 2020-09-17: 1200 [IU]/h via INTRAVENOUS
  Filled 2020-09-16 (×3): qty 250

## 2020-09-16 MED ORDER — HEPARIN BOLUS VIA INFUSION
5000.0000 [IU] | Freq: Once | INTRAVENOUS | Status: AC
  Administered 2020-09-17: 5000 [IU] via INTRAVENOUS
  Filled 2020-09-16: qty 5000

## 2020-09-16 MED ORDER — ONDANSETRON HCL 4 MG/2ML IJ SOLN
4.0000 mg | Freq: Once | INTRAMUSCULAR | Status: AC
  Administered 2020-09-17: 4 mg via INTRAVENOUS
  Filled 2020-09-16: qty 2

## 2020-09-16 NOTE — ED Notes (Signed)
No ct scan at this time per dr funke 

## 2020-09-16 NOTE — H&P (Signed)
History and Physical   Sophia Hampton TKW:[id number] DOB: 1990/12/28 DOA: 09/16/2020  Referring MD/NP/PA: Dr. Fuller Plan  PCP: System, Provider Not In   Outpatient Specialists: None  Patient coming from: Home  Chief Complaint: Headache  HPI: Sophia Hampton is a 30 y.o. female with medical history significant of no significant past medical history except for depression hyperlipidemia and insomnia who has been experiencing headaches especially in the right side of the head vertigo, blurry vision in the right eye.  She was seen recently and apparently sent to a neurologist who evaluated her and suspected multiple sclerosis.  Patient is awaiting insurance authorization for outpatient MRI but the headache is getting worse.  She has been taking over-the-counter medications including NSAIDs with no relief.  Patient therefore came to the ER today for evaluation.  MRI of the brain was performed showing progressive demyelination most likely consistent with multiple sclerosis.  Additionally showed cavernous sinus thrombosis including right internal jugular vein.  Patient is therefore being admitted to the hospital for management.  Neurology being consulted as well..  ED Course: Vitals as well as CBC chemistry all within normal.  MRI of the brain showed contrast-enhancing lesion in the left frontal lobe consistent with active demyelination.  There are multiple chronic white matter lesions in a pattern consistent with multiple sclerosis.  Patient also has thrombosis of the right transverse sinus sigmoid sinus and internal jugular veins.  Patient initiated on IV heparin.  Being admitted to the hospital for evaluation and treatment.  Review of Systems: As per HPI otherwise 10 point review of systems negative.    No past medical history on file.  No past surgical history on file.   reports that she has quit smoking. She has never used smokeless tobacco. She reports current alcohol use. She reports that she does not  use drugs.  No Known Allergies  No family history on file.   Prior to Admission medications   Medication Sig Start Date End Date Taking? Authorizing Provider  acetaminophen (TYLENOL) 325 MG tablet Take 650 mg by mouth every 6 (six) hours as needed for mild pain or moderate pain.    [provider]  diazepam (VALIUM) 5 MG tablet Take 1 tablet (5 mg total) by mouth every 6 (six) hours as needed for anxiety. Patient not taking: Reported on 02/02/2015 10/14/12   Earley Favor, NP  diclofenac (CATAFLAM) 50 MG tablet Take 1 tablet (50 mg total) by mouth 3 (three) times daily. Patient not taking: Reported on 02/02/2015 12/31/14   Linna Hoff, MD  escitalopram (LEXAPRO) 10 MG tablet Take 15 mg by mouth daily.     [provider]  ibuprofen (ADVIL,MOTRIN) 600 MG tablet Take 1 tablet (600 mg total) by mouth every 6 (six) hours as needed. 02/03/15   Earley Favor, NP  metaxalone (SKELAXIN) 800 MG tablet Take 1 tablet (800 mg total) by mouth 3 (three) times daily. As muscle relaxer Patient not taking: Reported on 02/02/2015 12/31/14   Linna Hoff, MD  norethindrone-ethinyl estradiol (JUNEL FE,GILDESS FE,LOESTRIN FE) 1-20 MG-MCG tablet Take 1 tablet by mouth daily.    [provider]  oxyCODONE-acetaminophen (PERCOCET/ROXICET) 5-325 MG tablet Take 1 tablet by mouth every 6 (six) hours as needed for severe pain. 02/03/15   Earley Favor, NP    Physical Exam: Vitals:   09/16/20 1924 09/16/20 1926 09/16/20 1934  BP:   115/78  Pulse: 80    Resp: 18    Temp: 98.9 F (37.2 C)  TempSrc: Oral    SpO2: 98%    Weight:  77.1 kg   Height:  5\' 2"  (1.575 m)       Constitutional: Acutely ill looking, mild distress Vitals:   09/16/20 1924 09/16/20 1926 09/16/20 1934  BP:   115/78  Pulse: 80    Resp: 18    Temp: 98.9 F (37.2 C)    TempSrc: Oral    SpO2: 98%    Weight:  77.1 kg   Height:  5\' 2"  (1.575 m)    Eyes: PERRL, lids and conjunctivae normal ENMT: Mucous membranes  are dry. Posterior pharynx clear of any exudate or lesions.Normal dentition.  Neck: normal, supple, no masses, no thyromegaly Respiratory: clear to auscultation bilaterally, no wheezing, no crackles. Normal respiratory effort. No accessory muscle use.  Cardiovascular: Regular rate and rhythm, no murmurs / rubs / gallops. No extremity edema. 2+ pedal pulses. No carotid bruits.  Abdomen: no tenderness, no masses palpated. No hepatosplenomegaly. Bowel sounds positive.  Musculoskeletal: no clubbing / cyanosis. No joint deformity upper and lower extremities. Good ROM, no contractures. Normal muscle tone.  Skin: no rashes, lesions, ulcers. No induration Neurologic: CN 2-12 grossly intact. Sensation intact, DTR normal. Strength 5/5 in all 4.  Psychiatric: Normal judgment and insight. Alert and oriented x 3. Normal mood.     Labs on Admission: I have personally reviewed following labs and imaging studies  CBC: Recent Labs  Lab 09/16/20 2115  WBC 9.8  NEUTROABS 6.6  HGB 14.0  HCT 42.2  MCV 83.1  PLT 213   Basic Metabolic Panel: Recent Labs  Lab 09/16/20 2115  NA 137  K 3.7  CL 104  CO2 24  GLUCOSE 92  BUN 10  CREATININE 0.50  CALCIUM 9.4   GFR: Estimated Creatinine Clearance: 99.8 mL/min (by C-G formula based on SCr of 0.5 mg/dL). Liver Function Tests: No results for input(s): AST, ALT, ALKPHOS, BILITOT, PROT, ALBUMIN in the last 168 hours. No results for input(s): LIPASE, AMYLASE in the last 168 hours. No results for input(s): AMMONIA in the last 168 hours. Coagulation Profile: No results for input(s): INR, PROTIME in the last 168 hours. Cardiac Enzymes: No results for input(s): CKTOTAL, CKMB, CKMBINDEX, TROPONINI in the last 168 hours. BNP (last 3 results) No results for input(s): PROBNP in the last 8760 hours. HbA1C: No results for input(s): HGBA1C in the last 72 hours. CBG: No results for input(s): GLUCAP in the last 168 hours. Lipid Profile: No results for input(s):  CHOL, HDL, LDLCALC, TRIG, CHOLHDL, LDLDIRECT in the last 72 hours. Thyroid Function Tests: No results for input(s): TSH, T4TOTAL, FREET4, T3FREE, THYROIDAB in the last 72 hours. Anemia Panel: No results for input(s): VITAMINB12, FOLATE, FERRITIN, TIBC, IRON, RETICCTPCT in the last 72 hours. Urine analysis:    Component Value Date/Time   COLORURINE YELLOW 02/02/2015 1511   APPEARANCEUR CLEAR 02/02/2015 1511   LABSPEC 1.005 02/02/2015 1511   PHURINE 6.5 02/02/2015 1511   GLUCOSEU NEGATIVE 02/02/2015 1511   HGBUR SMALL (A) 02/02/2015 1511   BILIRUBINUR NEGATIVE 02/02/2015 1511   KETONESUR NEGATIVE 02/02/2015 1511   PROTEINUR 100 (A) 02/02/2015 1511   NITRITE NEGATIVE 02/02/2015 1511   LEUKOCYTESUR NEGATIVE 02/02/2015 1511   Sepsis Labs: @LABRCNTIP (procalcitonin:4,lacticidven:4) )No results found for this or any previous visit (from the past 240 hour(s)).   Radiological Exams on Admission: MR Brain W and Wo Contrast  Result Date: 09/16/2020 CLINICAL DATA:  Right-sided head pain. History of multiple sclerosis Multiple sclerosis, new event EXAM:  MRI HEAD WITHOUT AND WITH CONTRAST TECHNIQUE: Multiplanar, multiecho pulse sequences of the brain and surrounding structures were obtained without and with intravenous contrast. CONTRAST:  16mL GADAVIST GADOBUTROL 1 MMOL/ML IV SOLN COMPARISON:  None. FINDINGS: Brain: No acute infarct, mass effect or extra-axial collection. There are 5-10 hyperintense T2-weighted signal lesions of the periventricular white matter. There is a contrast-enhancing lesion in the left frontal lobe (19:107). The midline structures are normal. Vascular: There is a large filling defect within the right transverse sinus that extends into the sigmoid sinus and internal jugular vein. Skull and upper cervical spine: Normal calvarium and skull base. Visualized upper cervical spine and soft tissues are normal. Sinuses/Orbits:No paranasal sinus fluid levels or advanced mucosal thickening.  No mastoid or middle ear effusion. Normal orbits. IMPRESSION: 1. Contrast-enhancing lesion in the left frontal lobe consistent with active demyelination. 2. Thrombosis of the right transverse sinus, sigmoid sinus and internal jugular vein. 3. Multiple chronic white matter lesions in a pattern consistent multiple sclerosis Electronically Signed   By: Deatra Robinson M.D.   On: 09/16/2020 22:50      Assessment/Plan Principal Problem:   Multiple sclerosis (HCC) Active Problems:   Depression   Hyperlipidemia   Insomnia   Cavernous sinus thrombosis     #1 cavernous sinus thrombosis: Patient has thrombosis of the right transverse sinus, sigmoid sinus and internal jugular vein.  Initiated on IV heparin.  We will get a neurology consultation.  Further evaluation per neurology recommendations.  Work-up for possible thromboembolic phenomena would likely follow-up including clotting factors.  #2 multiple sclerosis: New diagnosis.  Patient would likely benefit from steroids.  Will defer to neurology for recommendations.  #3 depression: We will confirm home regimen and continue  #4 hyperlipidemia: Not on a statin.  Continue outpatient management   DVT prophylaxis: Heparin drip Code Status: Full code Family Communication: No family at bedside Disposition Plan: Home Consults called: Teleneurology Admission status: Inpatient  Severity of Illness: The appropriate patient status for this patient is INPATIENT. Inpatient status is judged to be reasonable and necessary in order to provide the required intensity of service to ensure the patient's safety. The patient's presenting symptoms, physical exam findings, and initial radiographic and laboratory data in the context of their chronic comorbidities is felt to place them at high risk for further clinical deterioration. Furthermore, it is not anticipated that the patient will be medically stable for discharge from the hospital within 2 midnights of  admission. The following factors support the patient status of inpatient.   " The patient's presenting symptoms include headache and visual changes. " The worrisome physical exam findings include no focal deficits. " The initial radiographic and laboratory data are worrisome because of MRI findings consistent with multiple sclerosis and cavernous sinus thrombosis. " The chronic co-morbidities include none.   * I certify that at the point of admission it is my clinical judgment that the patient will require inpatient hospital care spanning beyond 2 midnights from the point of admission due to high intensity of service, high risk for further deterioration and high frequency of surveillance required.Lonia Blood MD [facility] Pager [phone]  If 7PM-7AM, please contact night-coverage [email or web address] Password [facility]  09/16/2020, 11:57 PM

## 2020-09-16 NOTE — ED Provider Notes (Signed)
[facility] Emergency Department Provider Note ____________________________________________  Time seen: Approximately 8:15 PM  I have reviewed the triage vital signs and the nursing notes.   HISTORY  Chief Complaint Headache   HPI Sophia Hampton is a 30 y.o. female presenting to the emergency department for treatment and evaluation of pain on the right side of her head without known injury.  She also has headache, vertigo, and blurry vision in the right eye.  Patient states that she has been evaluated by neurology for the symptoms and is awaiting an MRI of the brain to rule out MS.  Pain got worse today and she did not feel that she could wait any longer for testing.  No relief with NSAIDs. No past medical history on file.  Patient Active Problem List   Diagnosis Date Noted   Multiple sclerosis (HCC) 09/16/2020   Cavernous sinus thrombosis 09/16/2020   Hyperlipidemia 04/24/2019   Depression 02/12/2013   Insomnia 02/12/2013     No past surgical history on file.  Prior to Admission medications   Medication Sig Start Date End Date Taking? Authorizing Provider  sertraline (ZOLOFT) 50 MG tablet Take 50 mg by mouth daily. 09/08/20  Yes [provider]  SRONYX 0.1-20 MG-MCG tablet Take 1 tablet by mouth daily. 09/06/20  Yes [provider]  Turmeric (QC TUMERIC COMPLEX PO) Take 15 mLs by mouth at bedtime.   Yes [provider]    Allergies Patient has no known allergies.  No family history on file.  Social History Social History   Tobacco Use   Smoking status: Former   Smokeless tobacco: Never  Substance Use Topics   Alcohol use: Yes   Drug use: No    Review of Systems Constitutional: No fever/chills or recent injury. Eyes: No visual changes. ENT: No sore throat. Respiratory: Denies shortness of breath. Gastrointestinal: No abdominal pain.  No nausea, no vomiting.  No diarrhea.  No constipation. Musculoskeletal: Negative  for pain. Skin: Negative for rash. Neurological:Positive for headache, negative for focal weakness or numbness. No confusion or fainting.  Positive for progressive weakness in hands. ___________________________________________   PHYSICAL EXAM:  VITAL SIGNS: ED Triage Vitals  Enc Vitals Group     BP 09/16/20 1934 115/78     Pulse Rate 09/16/20 1924 80     Resp 09/16/20 1924 18     Temp 09/16/20 1924 98.9 F (37.2 C)     Temp Source 09/16/20 1924 Oral     SpO2 09/16/20 1924 98 %     Weight 09/16/20 1926 170 lb (77.1 kg)     Height 09/16/20 1926 5\' 2"  (1.575 m)     Head Circumference --      Peak Flow --      Pain Score 09/16/20 1926 8     Pain Loc --      Pain Edu? --      Excl. in GC? --     Constitutional: Alert and oriented. Well appearing and in no acute distress. Eyes: Conjunctivae are normal. PERRL. EOMI without expressed pain. No evidence of papilledema on limited exam. Head: Atraumatic.  Right side posterior aspect of the head and neck overlying the basal skull area tender to palpation. Nose: No congestion/rhinnorhea. Mouth/Throat: Mucous membranes are moist.  Oropharynx non-erythematous. Neck: No stridor. Supple, no meningismus.  Cardiovascular: Normal rate, regular rhythm. Grossly normal heart sounds.  Good peripheral circulation. Respiratory: Normal respiratory effort.  No retractions. Lungs CTAB. Gastrointestinal: Soft and nontender. No distention.  Musculoskeletal: No lower extremity tenderness nor edema.  No joint effusions. Neurologic:  Normal speech and language. No gross focal neurologic deficits are appreciated. No gait instability. Cranial nerves: 2-10 normal as tested. Cerebellar:Normal Romberg, finger-nose-finger, heel to shin, normal gait. Sensorimotor: No aphasia, pronator drift, clonus, sensory loss or abnormal reflexes.  Skin:  Skin is warm, dry and intact. No rash noted. Psychiatric: Mood and affect are normal. Speech and behavior are normal. Normal  thought process and cognition.  ____________________________________________   LABS (all labs ordered are listed, but only abnormal results are displayed)  Labs Reviewed  RESP PANEL BY RT-PCR (FLU A&B, COVID) [id number]  BASIC METABOLIC PANEL  CBC WITH DIFFERENTIAL/PLATELET  SEDIMENTATION RATE  PROTIME-INR  APTT   ____________________________________________  EKG  Not indicated. ____________________________________________  RADIOLOGY  MR Brain W and Wo Contrast  Result Date: 09/16/2020 CLINICAL DATA:  Right-sided head pain. History of multiple sclerosis Multiple sclerosis, new event EXAM: MRI HEAD WITHOUT AND WITH CONTRAST TECHNIQUE: Multiplanar, multiecho pulse sequences of the brain and surrounding structures were obtained without and with intravenous contrast. CONTRAST:  73mL GADAVIST GADOBUTROL 1 MMOL/ML IV SOLN COMPARISON:  None. FINDINGS: Brain: No acute infarct, mass effect or extra-axial collection. There are 5-10 hyperintense T2-weighted signal lesions of the periventricular white matter. There is a contrast-enhancing lesion in the left frontal lobe (19:107). The midline structures are normal. Vascular: There is a large filling defect within the right transverse sinus that extends into the sigmoid sinus and internal jugular vein. Skull and upper cervical spine: Normal calvarium and skull base. Visualized upper cervical spine and soft tissues are normal. Sinuses/Orbits:No paranasal sinus fluid levels or advanced mucosal thickening. No mastoid or middle ear effusion. Normal orbits. IMPRESSION: 1. Contrast-enhancing lesion in the left frontal lobe consistent with active demyelination. 2. Thrombosis of the right transverse sinus, sigmoid sinus and internal jugular vein. 3. Multiple chronic white matter lesions in a pattern consistent multiple sclerosis Electronically Signed   By: Deatra Robinson M.D.   On: 09/16/2020 22:50    ____________________________________________   PROCEDURES  Procedure(s) performed:  Procedures  Critical Care performed:   CRITICAL CARE Performed by: Kem Boroughs   Total critical care time: 45 minutes  Critical care time was exclusive of separately billable procedures and treating other patients.  Critical care was necessary to treat or prevent imminent or life-threatening deterioration.  Critical care was time spent personally by me on the following activities: development of treatment plan with patient and/or surrogate as well as nursing, discussions with consultants, evaluation of patient's response to treatment, examination of patient, obtaining history from patient or surrogate, ordering and performing treatments and interventions, ordering and review of laboratory studies, ordering and review of radiographic studies, pulse oximetry and re-evaluation of patient's condition.  ____________________________________________   INITIAL IMPRESSION / ASSESSMENT AND PLAN / ED COURSE  30 year old female presenting to the emergency department for treatment and evaluation of headache and remaining symptoms as described in the HPI.  Upon chart review of the neurology note, she has had visual changes with headaches, eye pain, numbness and tingling in the extremities for approximately 6 to 7 months.  As a child she had a history of migraine headaches at around age 53 but had not had any issues as an adult.  MRI does show evidence of MS with acute demyelinization.  Also shows acute venous sinus thrombosis.  Plan will be to start heparin and get her admitted through the hospitalist services.  Per Dr. Maxwell Caul request, neurosurgery contacted to  get any input/intervention necessary tonight.  No additional recommendation at this time from the neurosurgery standpoint. Will request a tele-neuro consult as we do not have anyone on call specifically for [facility].    Patient admitted and moved to room 15.  Dr. Dolores Frame will take over care and follow up on tele-neuro recommendations.  Pertinent labs & imaging results that were available during my care of the patient were reviewed by me and considered in my medical decision making (see chart for details). ____________________________________________   FINAL CLINICAL IMPRESSION(S) / ED DIAGNOSES  Final diagnoses:  Multiple sclerosis (HCC)  Acute cerebral venous sinus thrombosis    ED Discharge Orders     None        Chinita Pester, FNP 09/17/20 0018    Irean Hong, MD 09/20/20 [phone]

## 2020-09-16 NOTE — ED Triage Notes (Signed)
Pt has pain in the back of right side of head.  Pt reports tenderness to right side of head.  Pt has nausea. Sx for 2 days    No known injury.  Pt also reports vertigo.  Pt alert  speech clear.

## 2020-09-17 ENCOUNTER — Inpatient Hospital Stay: Payer: 59

## 2020-09-17 ENCOUNTER — Encounter: Payer: Self-pay | Admitting: Internal Medicine

## 2020-09-17 DIAGNOSIS — G35 Multiple sclerosis: Principal | ICD-10-CM

## 2020-09-17 LAB — COMPREHENSIVE METABOLIC PANEL
ALT: 35 U/L (ref 0–44)
AST: 22 U/L (ref 15–41)
Albumin: 3.6 g/dL (ref 3.5–5.0)
Alkaline Phosphatase: 53 U/L (ref 38–126)
Anion gap: 11 (ref 5–15)
BUN: 8 mg/dL (ref 6–20)
CO2: 22 mmol/L (ref 22–32)
Calcium: 9.2 mg/dL (ref 8.9–10.3)
Chloride: 104 mmol/L (ref 98–111)
Creatinine, Ser: 0.57 mg/dL (ref 0.44–1.00)
GFR, Estimated: >60 mL/min (ref >60)
Glucose, Bld: 119 mg/dL — ABNORMAL HIGH (ref 70–99)
Potassium: 3.8 mmol/L (ref 3.5–5.1)
Sodium: 137 mmol/L (ref 135–145)
Total Bilirubin: 0.7 mg/dL (ref 0.3–1.2)
Total Protein: 7.4 g/dL (ref 6.5–8.1)

## 2020-09-17 LAB — CBC
HCT: 40.9 % (ref 36.0–46.0)
Hemoglobin: 13.1 g/dL (ref 12.0–15.0)
MCH: 26.9 pg (ref 26.0–34.0)
MCHC: 32 g/dL (ref 30.0–36.0)
MCV: 84 fL (ref 80.0–100.0)
Platelets: 221 10*3/uL (ref 150–400)
RBC: 4.87 MIL/uL (ref 3.87–5.11)
RDW: 13.1 % (ref 11.5–15.5)
WBC: 11.3 10*3/uL — ABNORMAL HIGH (ref 4.0–10.5)
nRBC: 0 % (ref 0.0–0.2)

## 2020-09-17 LAB — PROTIME-INR
INR: 1.1 (ref 0.8–1.2)
Prothrombin Time: 13.7 seconds (ref 11.4–15.2)

## 2020-09-17 LAB — HIV ANTIBODY (ROUTINE TESTING W REFLEX): HIV Screen 4th Generation wRfx: NONREACTIVE

## 2020-09-17 LAB — APTT: aPTT: 24 seconds (ref 24–36)

## 2020-09-17 LAB — HEPARIN LEVEL (UNFRACTIONATED)
Heparin Unfractionated: 0.75 IU/mL — ABNORMAL HIGH (ref 0.30–0.70)
Heparin Unfractionated: 0.44 IU/mL (ref 0.30–0.70)
Heparin Unfractionated: 0.32 IU/mL (ref 0.30–0.70)

## 2020-09-17 LAB — RESP PANEL BY RT-PCR (FLU A&B, COVID) ARPGX2
Influenza A by PCR: NEGATIVE
Influenza B by PCR: NEGATIVE
SARS Coronavirus 2 by RT PCR: NEGATIVE

## 2020-09-17 IMAGING — MR MR CERVICAL SPINE WO/W CM
5 of 8 series · 29 of 48 positions shown · IV contrast (7ml Gadavist)
Comparison: None.

CLINICAL DATA: Demyelinating disease.

EXAM:
MRI CERVICAL AND THORACIC SPINE WITHOUT AND WITH CONTRAST
TECHNIQUE: Multiplanar and multiecho pulse sequences of the cervical spine, to
include the craniocervical junction and cervicothoracic junction,
and the thoracic spine, were obtained without and with intravenous
contrast.
CONTRAST:  7mL GADAVIST GADOBUTROL 1 MMOL/ML IV SOLN

[Series 27: T2 · sagittal · 3.0mm · 0.62mm/px · 4 of 15 slices shown (1 of 2)]
[im 1/15]
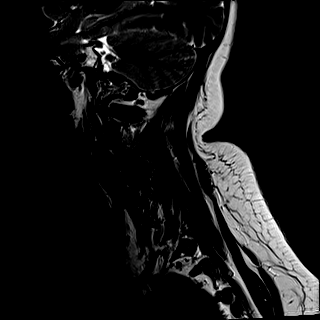
[im 5/15]
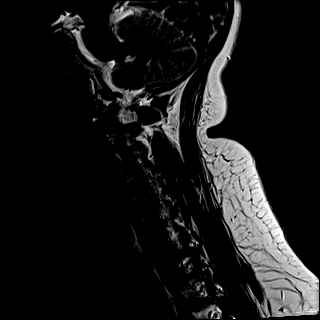
[im 10/15]
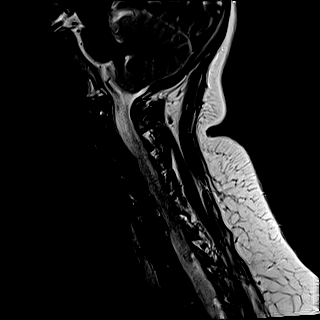
[im 15/15]
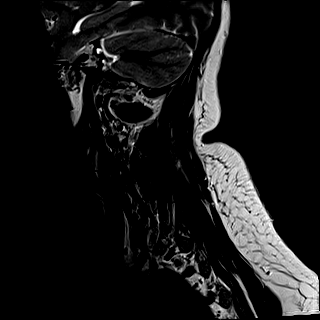

[Series 29: STIR · sagittal · 3.0mm · 0.62mm/px · 4 of 15 slices shown]
[im 1/15]
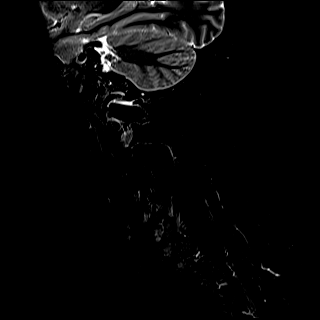
[im 5/15]
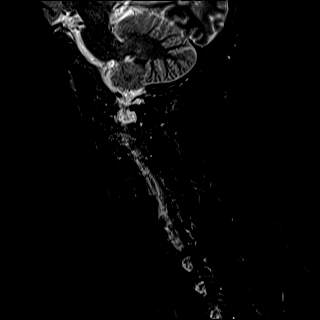
[im 10/15]
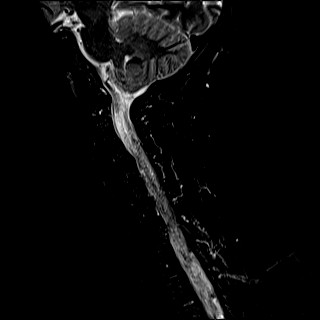
[im 15/15]
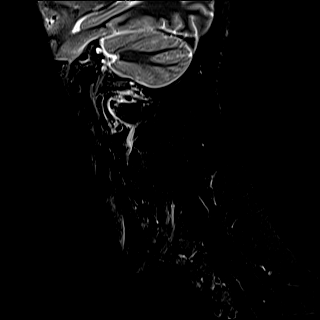

[Series 30: T2 · axial · 3.0mm · 0.70mm/px · z∈[-110,-5]mm · 8 of 33 slices shown (2 of 2)]
[im 1/33]
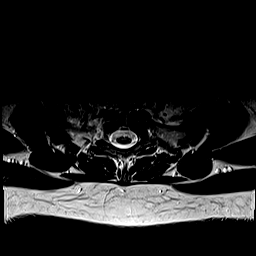
[im 5/33]
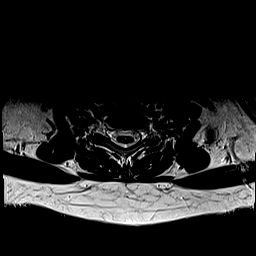
[im 10/33]
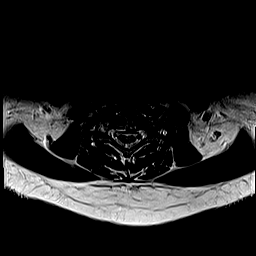
[im 14/33]
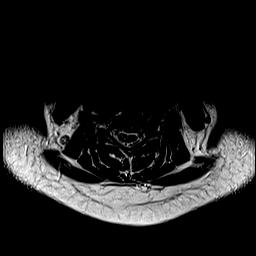
[im 19/33]
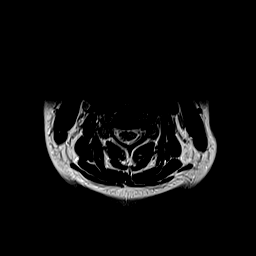
[im 23/33]
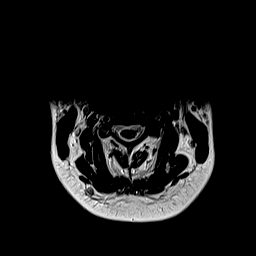
[im 28/33]
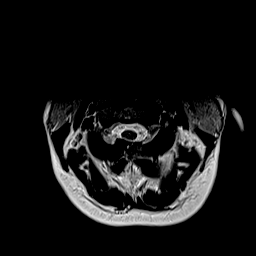
[im 33/33]
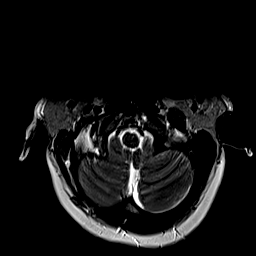

[Series 32: T1 · axial · non-contrast · 3.0mm · 0.35mm/px · z∈[-110,-5]mm · 8 of 33 slices shown]
[im 1/33]
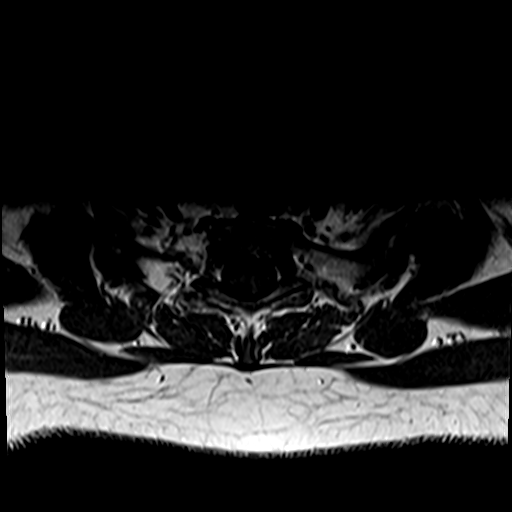
[im 5/33]
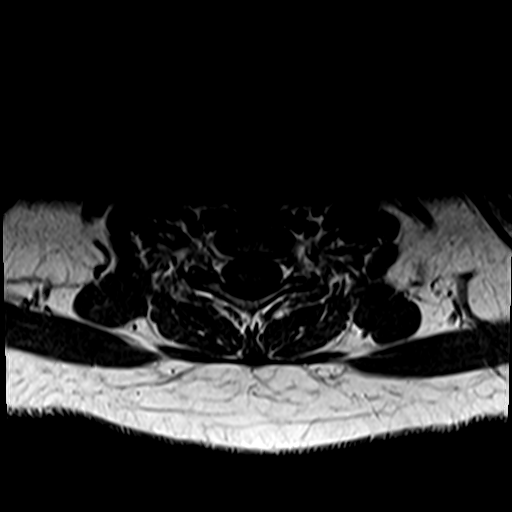
[im 10/33]
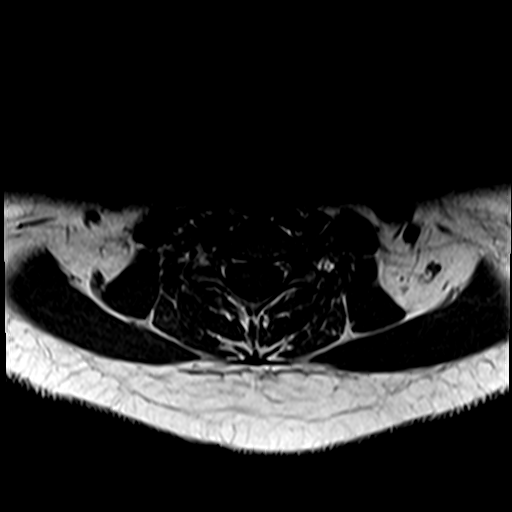
[im 14/33]
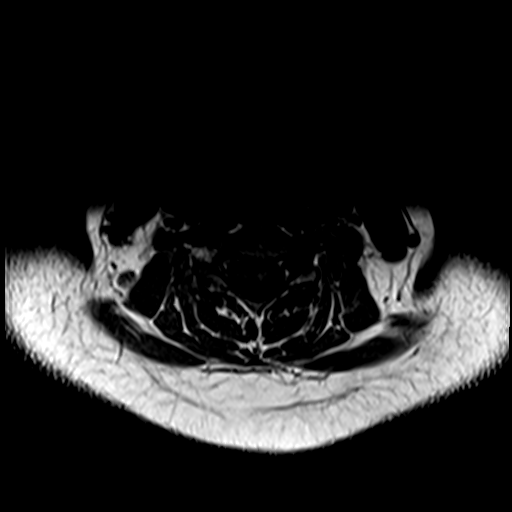
[im 19/33]
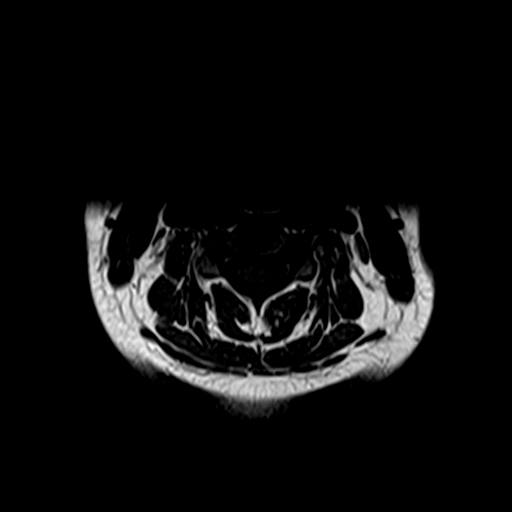
[im 23/33]
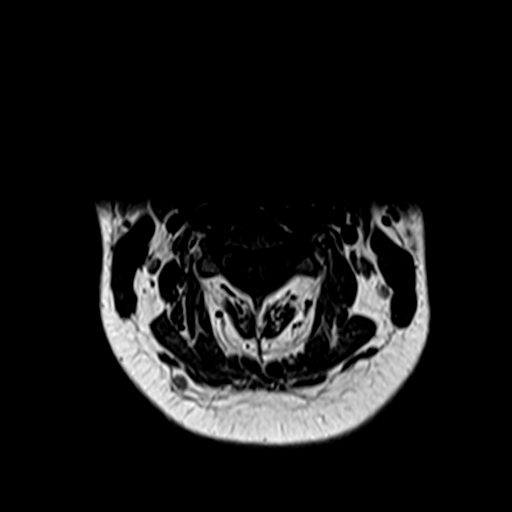
[im 28/33]
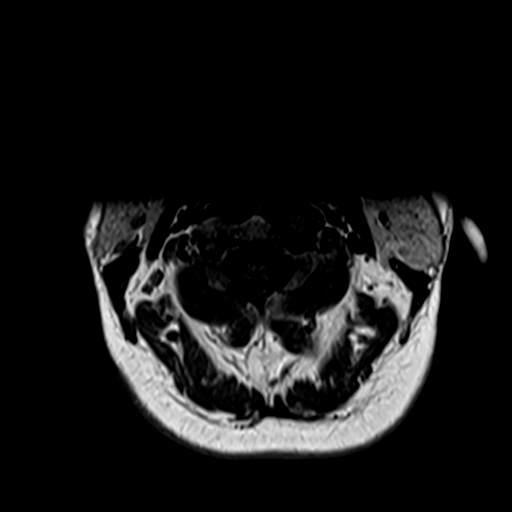
[im 33/33]
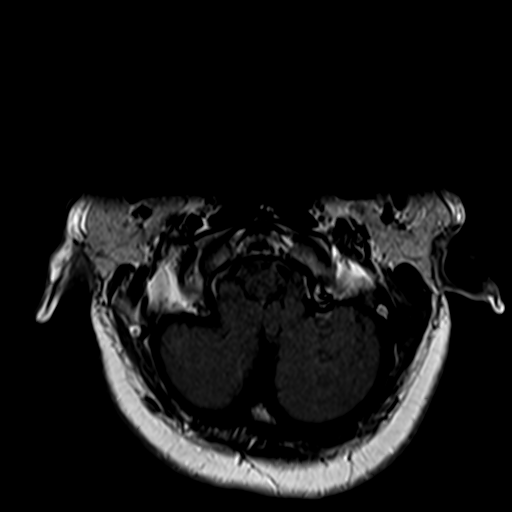

[Series 34: T1 post-contrast · axial · 3.0mm · 0.35mm/px · z∈[-110,-51]mm · 5 of 33 slices shown]
[im 1/33]
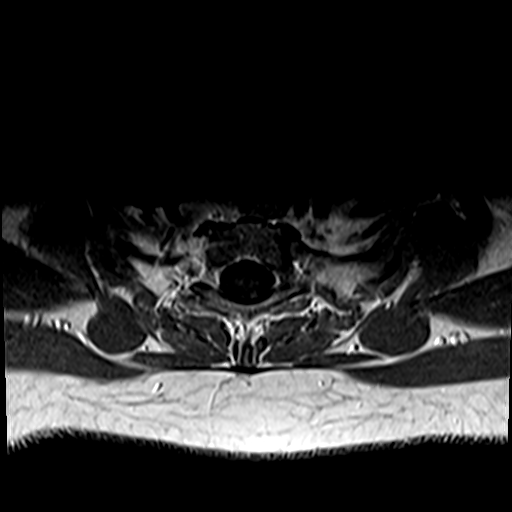
[im 5/33]
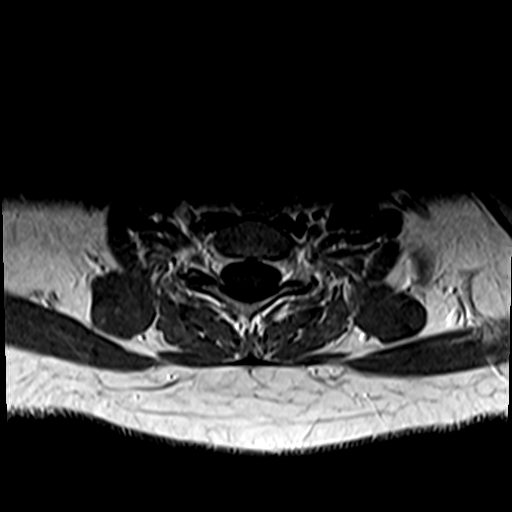
[im 10/33]
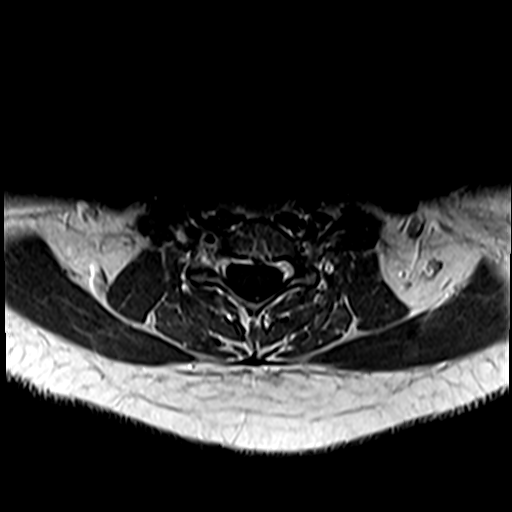
[im 14/33]
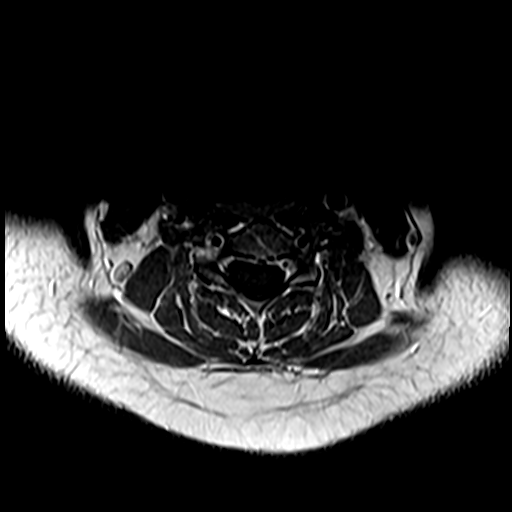
[im 19/33]
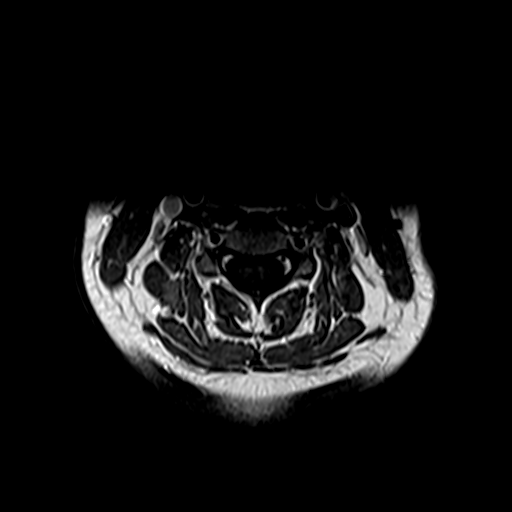

[29 of 48 positions shown; findings below may reference images not displayed]

FINDINGS: MRI CERVICAL SPINE FINDINGS

Alignment: Physiologic.  Reversal of normal lordotic curvature.

Vertebrae: No fracture, evidence of discitis, or bone lesion.

Cord: Normal signal and morphology.

Posterior Fossa, vertebral arteries, paraspinal tissues: Negative.

Disc levels:

C1-2: Unremarkable.

C2-3: Normal disc space and facet joints. There is no spinal canal
stenosis. No neural foraminal stenosis.

C3-4: Normal disc space and facet joints. There is no spinal canal
stenosis. No neural foraminal stenosis.

C4-5: Small central disc protrusion. There is no spinal canal
stenosis. No neural foraminal stenosis.

C5-6: Small left subarticular disc protrusion. There is no spinal
canal stenosis. No neural foraminal stenosis.

C6-7: Minimal disc bulge. There is no spinal canal stenosis. No
neural foraminal stenosis.

C7-T1: Normal disc space and facet joints. There is no spinal canal
stenosis. No neural foraminal stenosis.

MRI THORACIC SPINE FINDINGS

Alignment:  Physiologic.

Vertebrae: No fracture, evidence of discitis, or bone lesion.

Cord:  Normal signal and morphology.

Paraspinal and other soft tissues: Negative.

Disc levels:

No spinal canal or neural foraminal stenosis.
IMPRESSION: 1. No evidence of demyelinating disease of the cervical or thoracic
spine.
2. Mild cervical degenerative disc disease without spinal canal or
neural foraminal stenosis.

## 2020-09-17 IMAGING — MR MR THORACIC SPINE WO/W CM
7 of 9 series · 29 of 48 positions shown · IV contrast (gadavist)
Comparison: None.

CLINICAL DATA: Demyelinating disease.

EXAM:
MRI CERVICAL AND THORACIC SPINE WITHOUT AND WITH CONTRAST
TECHNIQUE: Multiplanar and multiecho pulse sequences of the cervical spine, to
include the craniocervical junction and cervicothoracic junction,
and the thoracic spine, were obtained without and with intravenous
contrast.
CONTRAST:  7mL GADAVIST GADOBUTROL 1 MMOL/ML IV SOLN

[Series 16: T1 · sagittal · 5.0mm · 1.88mm/px · 1 of 9 slices shown (1 of 3)]
[im 1/9]
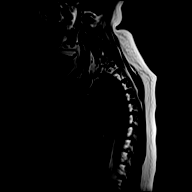

[Series 17: T2 · sagittal · 3.0mm · 1.06mm/px · 3 of 17 slices shown (1 of 2)]
[im 1/17]
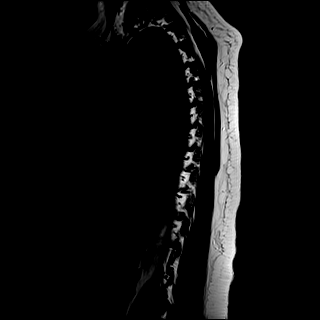
[im 9/17]
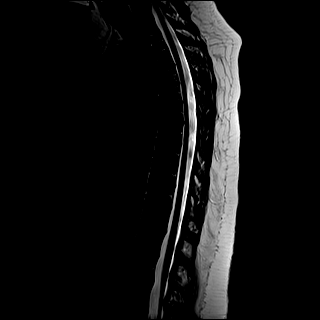
[im 17/17]
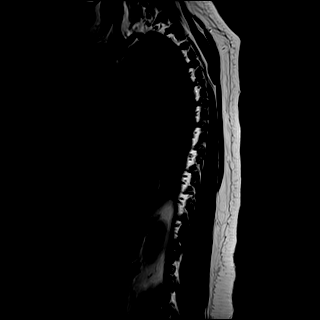

[Series 18: T1 · sagittal · 3.0mm · 1.06mm/px · 4 of 17 slices shown (2 of 3)]
[im 1/17]
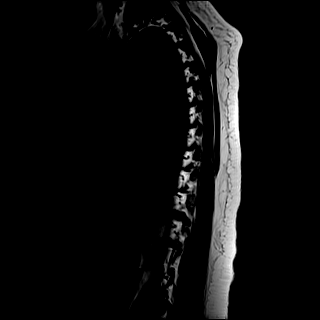
[im 6/17]
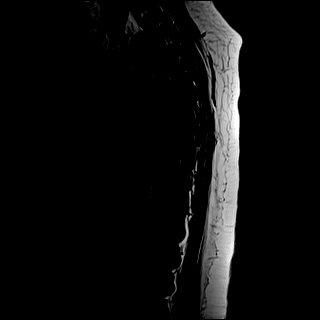
[im 11/17]
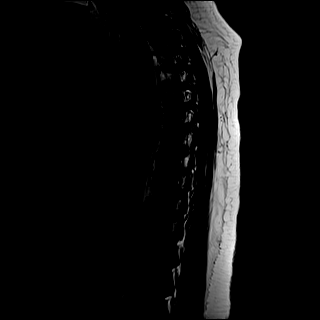
[im 17/17]
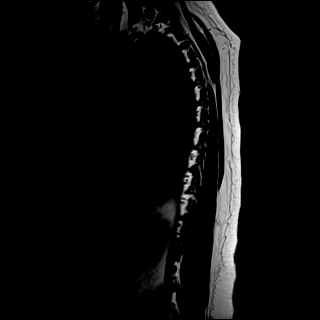

[Series 19: STIR · sagittal · 3.0mm · 0.53mm/px · 1 of 17 slices shown]
[im 1/17]
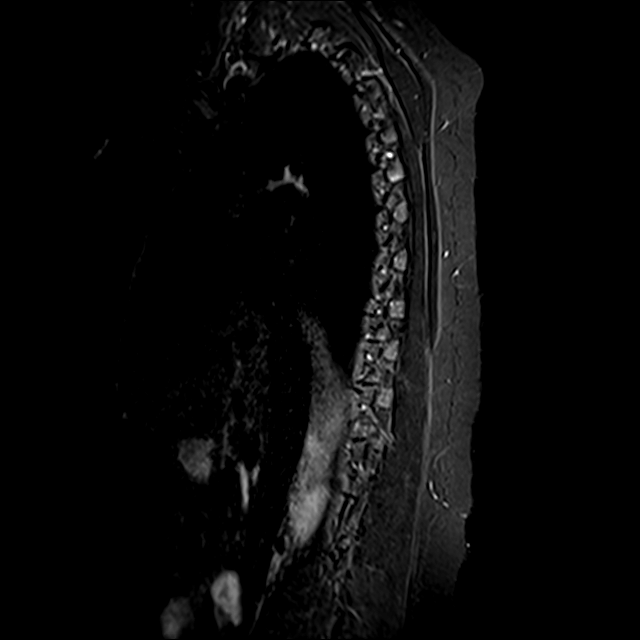

[Series 20: T2 · axial · 4.0mm · 0.59mm/px · z∈[-311,-93]mm · 8 of 39 slices shown (2 of 2)]
[im 1/39]
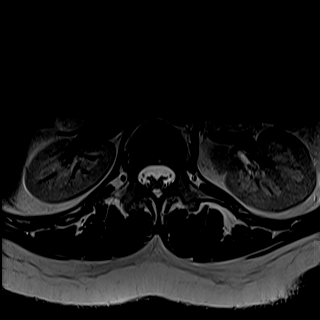
[im 6/39]
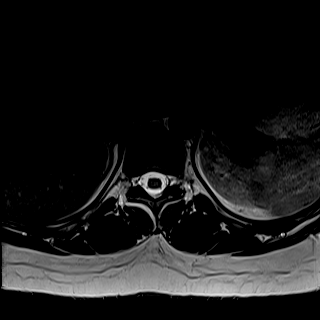
[im 11/39]
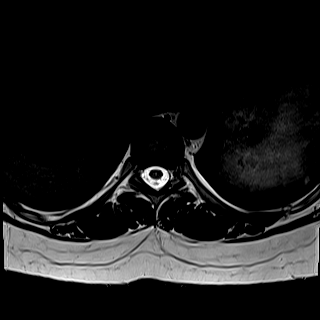
[im 17/39]
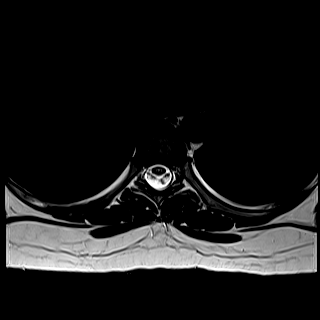
[im 22/39]
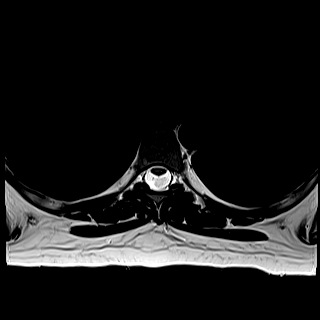
[im 28/39]
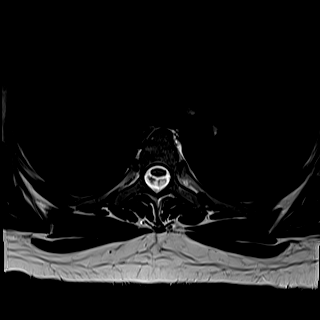
[im 33/39]
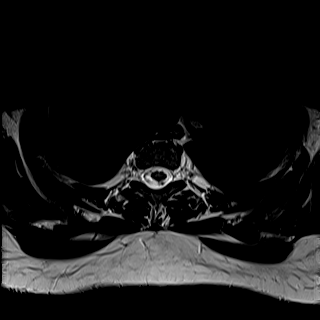
[im 39/39]
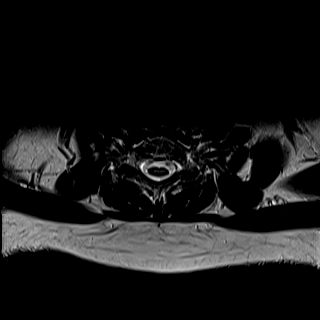

[Series 22: T1 · axial · non-contrast · 4.0mm · 0.37mm/px · z∈[-311,-93]mm · 8 of 39 slices shown (3 of 3)]
[im 1/39]
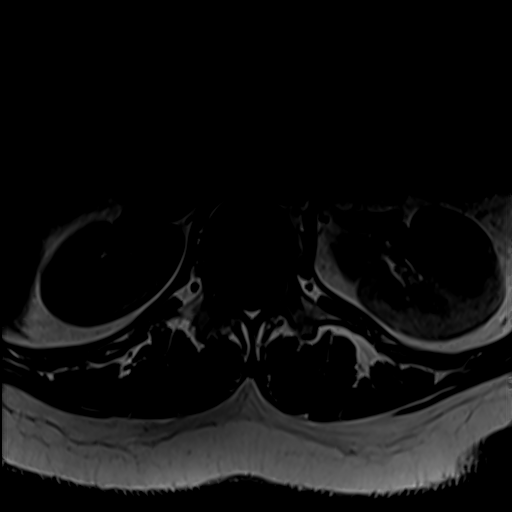
[im 6/39]
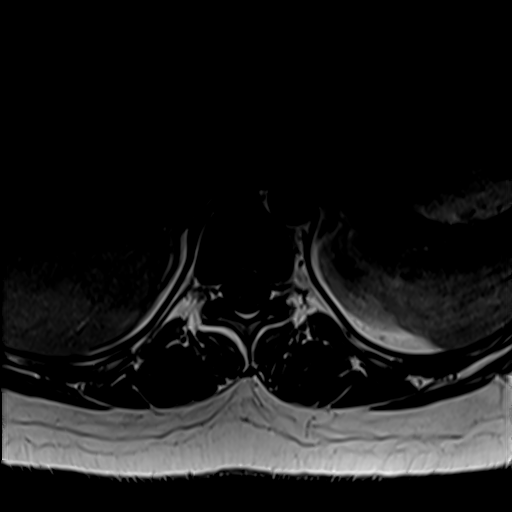
[im 11/39]
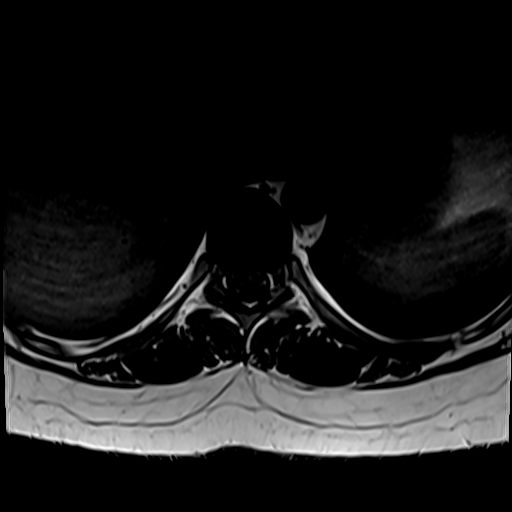
[im 17/39]
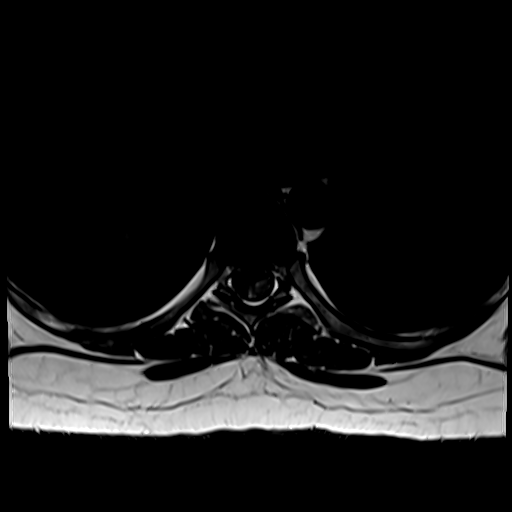
[im 22/39]
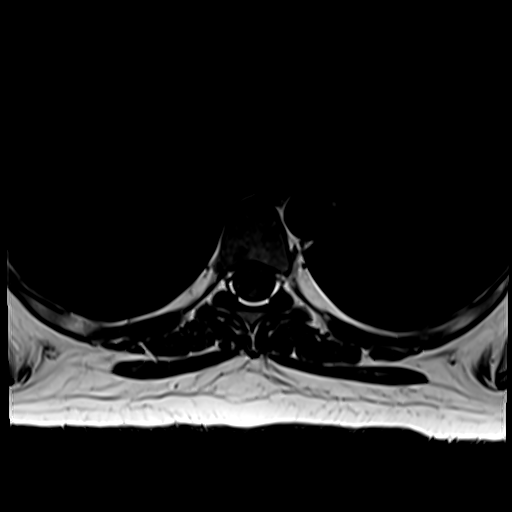
[im 28/39]
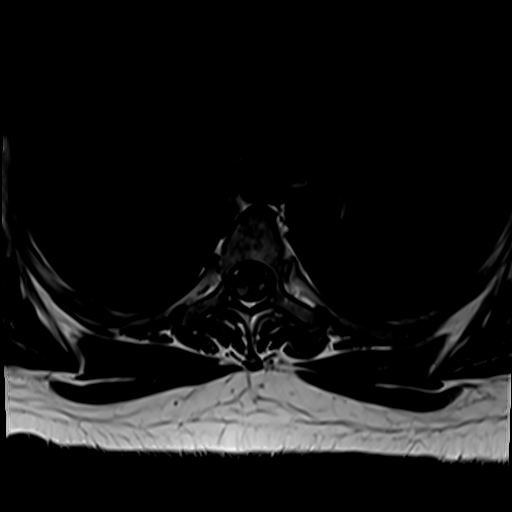
[im 33/39]
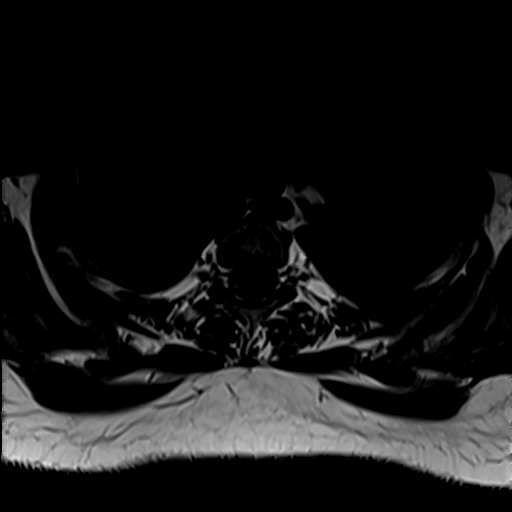
[im 39/39]
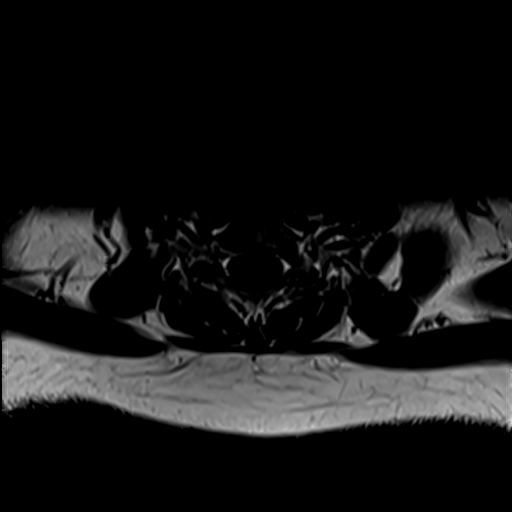

[Series 36: T1 fat-sat post-contrast · sagittal · 3.0mm · 1.06mm/px · 4 of 17 slices shown]
[im 1/17]
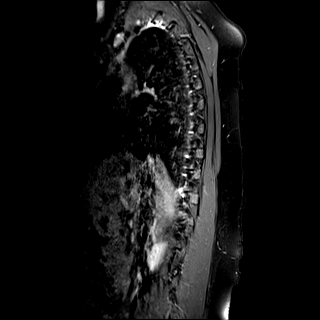
[im 6/17]
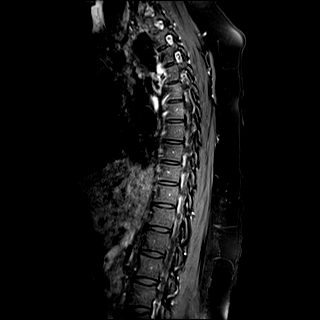
[im 11/17]
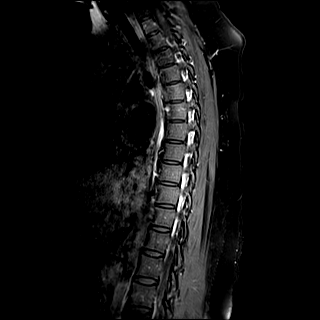
[im 17/17]
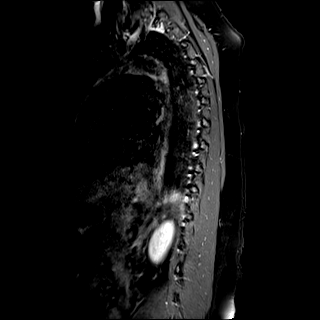

[29 of 48 positions shown; findings below may reference images not displayed]

FINDINGS: MRI CERVICAL SPINE FINDINGS

Alignment: Physiologic.  Reversal of normal lordotic curvature.

Vertebrae: No fracture, evidence of discitis, or bone lesion.

Cord: Normal signal and morphology.

Posterior Fossa, vertebral arteries, paraspinal tissues: Negative.

Disc levels:

C1-2: Unremarkable.

C2-3: Normal disc space and facet joints. There is no spinal canal
stenosis. No neural foraminal stenosis.

C3-4: Normal disc space and facet joints. There is no spinal canal
stenosis. No neural foraminal stenosis.

C4-5: Small central disc protrusion. There is no spinal canal
stenosis. No neural foraminal stenosis.

C5-6: Small left subarticular disc protrusion. There is no spinal
canal stenosis. No neural foraminal stenosis.

C6-7: Minimal disc bulge. There is no spinal canal stenosis. No
neural foraminal stenosis.

C7-T1: Normal disc space and facet joints. There is no spinal canal
stenosis. No neural foraminal stenosis.

MRI THORACIC SPINE FINDINGS

Alignment:  Physiologic.

Vertebrae: No fracture, evidence of discitis, or bone lesion.

Cord:  Normal signal and morphology.

Paraspinal and other soft tissues: Negative.

Disc levels:

No spinal canal or neural foraminal stenosis.
IMPRESSION: 1. No evidence of demyelinating disease of the cervical or thoracic
spine.
2. Mild cervical degenerative disc disease without spinal canal or
neural foraminal stenosis.

## 2020-09-17 MED ORDER — SERTRALINE HCL 50 MG PO TABS
50.0000 mg | ORAL_TABLET | Freq: Every day | ORAL | Status: DC
  Administered 2020-09-17: 50 mg via ORAL
  Filled 2020-09-17: qty 1

## 2020-09-17 MED ORDER — ONDANSETRON HCL 4 MG PO TABS
4.0000 mg | ORAL_TABLET | Freq: Four times a day (QID) | ORAL | Status: DC | PRN
  Administered 2020-09-17 – 2020-09-18 (×2): 4 mg via ORAL
  Filled 2020-09-17 (×2): qty 1

## 2020-09-17 MED ORDER — GADOBUTROL 1 MMOL/ML IV SOLN
7.0000 mL | Freq: Once | INTRAVENOUS | Status: AC | PRN
  Administered 2020-09-17: 7 mL via INTRAVENOUS

## 2020-09-17 MED ORDER — SODIUM CHLORIDE 0.9 % IV SOLN
1000.0000 mg | INTRAVENOUS | Status: DC
  Administered 2020-09-17 – 2020-09-19 (×2): 1000 mg via INTRAVENOUS
  Filled 2020-09-17 (×2): qty 8
  Filled 2020-09-17: qty 1000
  Filled 2020-09-17 (×2): qty 8

## 2020-09-17 MED ORDER — OXYCODONE HCL 5 MG PO TABS
5.0000 mg | ORAL_TABLET | Freq: Four times a day (QID) | ORAL | Status: DC | PRN
  Administered 2020-09-17 – 2020-09-18 (×4): 5 mg via ORAL
  Filled 2020-09-17 (×4): qty 1

## 2020-09-17 MED ORDER — MORPHINE SULFATE (PF) 2 MG/ML IV SOLN
2.0000 mg | INTRAVENOUS | Status: DC | PRN
  Administered 2020-09-17 (×2): 2 mg via INTRAVENOUS
  Filled 2020-09-17 (×3): qty 1

## 2020-09-17 MED ORDER — ONDANSETRON HCL 4 MG/2ML IJ SOLN: 4.0000 mg | Freq: Four times a day (QID) | INTRAMUSCULAR | Status: DC | PRN

## 2020-09-17 MED ORDER — BUTALBITAL-APAP-CAFFEINE 50-325-40 MG PO TABS
1.0000 | ORAL_TABLET | Freq: Four times a day (QID) | ORAL | Status: DC | PRN
  Administered 2020-09-17 – 2020-09-18 (×4): 1 via ORAL
  Filled 2020-09-17 (×4): qty 1

## 2020-09-17 MED ORDER — SODIUM CHLORIDE 0.9 % IV SOLN
INTRAVENOUS | Status: DC
  Administered 2020-09-18: 1000 mL via INTRAVENOUS

## 2020-09-17 MED ORDER — SERTRALINE HCL 50 MG PO TABS
50.0000 mg | ORAL_TABLET | Freq: Every day | ORAL | Status: DC
  Administered 2020-09-18 – 2020-09-20 (×3): 50 mg via ORAL
  Filled 2020-09-17 (×3): qty 1

## 2020-09-17 MED ORDER — ACETAMINOPHEN 325 MG PO TABS
650.0000 mg | ORAL_TABLET | Freq: Four times a day (QID) | ORAL | Status: DC | PRN
  Administered 2020-09-20: 650 mg via ORAL
  Filled 2020-09-17: qty 2

## 2020-09-17 NOTE — Consult Note (Signed)
[facility] Tele[facility]urology Consult Services   Date of Service:   09/17/2020 00:21:46  Diagnosis:       G35 - Multiple sclerosis  Impression:      75 F, h/o hyperlipidemia, who is here with several days of R sided posterior HA and several weeks of R eye blurriness. She has had several episodes of paresthesias that each last for about a month, affecting her face/mouth in Jan and both her legs/feet in March.              MRI brain w/ and w/o contrast       1. Contrast-enhancing lesion in the left frontal lobe consistent       with active demyelination.       2. Thrombosis of the right transverse sinus, sigmoid sinus and       internal jugular vein.       3. Multiple chronic white matter lesions in a pattern consistent       multiple sclerosis.              Pt needs to be admitted for mgmt of 1) venous thrombosis and 2) likely multiple sclerosis flare.              PLAN       - defer on CTV or MRV for now as thrombosis looks evident on MRI and pt will be anticoagulated regardless       - spinal tap is not required at this time given that pt's MRI findings and clinical pattern seem highly suggestive of MS       - admit for 5 day course of IV solumedrol at 1000 mg daily       - outpt neurology referral to determine proper immuno-modulating regimen       - start hep gtt and transition to PO anticoagulant for treatment of sinus thrombosis       - MRI C and T spine with and without contrast to eval for lesions in the spinal cord       - dc OCP given presence of blood clot       - neuro to follow  Metrics: Last Known Well: Unknown [facility] Notification Time: 09/17/2020 00:20:58 Arrival Time: 09/17/2020 18:50:00 Stamp Time: 09/17/2020 00:21:46 Initial Response Time: 09/17/2020 00:27:02 Symptoms: R sided HA, R blurred vision. NIHSS Start Assessment Time: 09/17/2020 00:27:22 Patient is not a candidate for Thrombolytic. Thrombolytic Medical Decision: 09/17/2020  00:30:19 Patient was not deemed candidate for Thrombolytic because of following reasons: Other Diagnosis suspected.  Image Unavailable - Discussed with Radiologist: As per Radiologist CT [facility]  ED Physician notified of diagnostic impression and management plan on 09/17/2020 01:17:03  Advanced Imaging: Advanced Imaging Not Recommended because: not LVO  Our recommendations are outlined below.  Recommendations:       Neuro Checks       Bedside Swallow Eval       DVT Prophylaxis       IV Fluids, Normal Saline       Head of Bed 30 Degrees       Euglycemia and Avoid Hyperthermia (PRN Acetaminophen)   Sign Out:       Discussed with Emergency Department Provider  ------------------------------------------------------------------------------  History of Present Illness: Patient is a 30 year old Female.  Patient was brought by private transportation with symptoms of R sided HA, R blurred vision.  83 F, h/o hyperlipidemia, who is here with complaints of headache and blurred vision. Pt course dates  back to Dec 2021 when she started developing new onset frequent headaches. Then in Jan 2022, she had an onset of numbness and tingling in the roof of her mouth and on her face that lasted approx 1 month before it resolved. In March, she developed numbness/tingling in both of her legs and feet. This also lasted about a month and then resolved. In June, she initially noted onset of pain in the back of her head on the R, assoc with some blurred vision in the R eye. The HA resolved after a few days but the blurred vision lasted for a few weeks. She comes to the hospital today bc she has had recurrence of her R sided posterior HA for the last 2-3 days and her R eye is still blurred in various parts. Left eye is normal. NIHSS 0. She is on OCP (Sronyx) and does not smoke cigarettes.   Anticoagulant use:  No  Antiplatelet use: No  Allergies:  Reviewed    Examination: 1A: Level of Consciousness -  Alert; keenly responsive + 0 1B: Ask Month and Age - Both Questions Right + 0 1C: Blink Eyes & Squeeze Hands - Performs Both Tasks + 0 2: Test Horizontal Extraocular Movements - Normal + 0 3: Test Visual Fields - No Visual Loss + 0 4: Test Facial Palsy (Use Grimace if Obtunded) - Normal symmetry + 0 5A: Test Left Arm Motor Drift - No Drift for 10 Seconds + 0 5B: Test Right Arm Motor Drift - No Drift for 10 Seconds + 0 6A: Test Left Leg Motor Drift - No Drift for 5 Seconds + 0 6B: Test Right Leg Motor Drift - No Drift for 5 Seconds + 0 7: Test Limb Ataxia (FNF/Heel-Shin) - No Ataxia + 0 8: Test Sensation - Normal; No sensory loss + 0 9: Test Language/Aphasia - Normal; No aphasia + 0 10: Test Dysarthria - Normal + 0 11: Test Extinction/Inattention - No abnormality + 0  NIHSS Score: 0   Pre-Morbid Modified Rankin Scale: 0 Points = No symptoms at all   Patient/Family was informed the Neurology Consult would occur via TeleHealth consult by way of interactive audio and video telecommunications and consented to receiving care in this manner.   Patient is being evaluated for possible acute neurologic impairment and high probability of imminent or life-threatening deterioration. I spent total of 42 minutes providing care to this patient, including time for face to face visit via telemedicine, review of medical records, imaging studies and discussion of findings with providers, the patient and/or family.   Dr Othelia Pulling   [facility] [phone]  Case [id number]

## 2020-09-17 NOTE — Consult Note (Signed)
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  She was evaluated by a neurologist who suggested an MRI of her brain but they were awaiting insurance approval.  Headaches worsened and were unrelieved with OTC medications.  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  She was started on IV heparin and was admitted for further work-up and evaluation.  Oncology/hematology was consulted.  Per chart review patient has a BMI of 31.09.  She was on oral contraception until her hospitalization where this was discontinued.  She is currently not pregnant.  Last menstrual cycle was 2 to 3 weeks ago.  She has been vaccinated x3 against COVID-19.   INTERVAL HISTORY: Met with patient in her hospital room today.  She states she is feeling better.  Her headache has improved some but still admits to soreness to right neck.  Reports intermittent neurological problem since December including muscle pain and spasms.  Reports seeing a massage therapist weekly and has been using a massage gun on her neck and base of her skull.  Admits to a fairly sedentary lifestyle.  She works from home.  She denies any recent travel or surgeries.  She has had all 3 COVID-19 vaccines.  Denies a family history of a clotting disorder or cancer.  Her dad has had a heart attack and her mom was  positive for alpha-1 deficiency and schizophrenia.   REVIEW OF SYSTEMS:   Review of Systems  Constitutional:  Positive for malaise/fatigue. Negative for chills, fever and weight loss.  HENT:  Negative for congestion, ear pain and tinnitus.   Eyes:  Positive for blurred vision. Negative for double vision.  Respiratory: Negative.  Negative for cough, sputum production and shortness of breath.   Cardiovascular: Negative.  Negative for chest pain, palpitations and leg swelling.  Gastrointestinal: Negative.  Negative for abdominal pain, constipation, diarrhea, nausea and vomiting.  Genitourinary:  Negative for dysuria, frequency and urgency.  Musculoskeletal:  Negative for back pain and falls.  Skin: Negative.  Negative for rash.  Neurological:  Positive for weakness and headaches.  Endo/Heme/Allergies: Negative.  Does not bruise/bleed easily.  Psychiatric/Behavioral:  Negative for depression. The patient has insomnia. The patient is not nervous/anxious.    As per HPI. Otherwise, a complete review of systems is negative.  PAST MEDICAL HISTORY: No past medical history on file.  PAST SURGICAL HISTORY: No past surgical history on file.  FAMILY HISTORY: No family history on file.  ADVANCED DIRECTIVES (Y/N):  _0 @  HEALTH MAINTENANCE: Social History   Tobacco Use   Smoking status: Former   Smokeless tobacco: Never  Substance Use Topics   Alcohol use: Yes   Drug use: Yes    Types: Marijuana     Colonoscopy:  PAP:  Bone density:  Lipid panel:  No Known Allergies  Current Facility-Administered Medications  Medication Dose Route Frequency Provider Last Rate Last Admin   0.9 %  sodium chloride infusion   Intravenous  Continuous Elwyn Reach, MD 100 mL/hr at 09/17/20 1200 New Bag at 09/17/20 1200   acetaminophen (TYLENOL) tablet 650 mg  650 mg Oral Q6H PRN Val Riles, MD       butalbital-acetaminophen-caffeine (FIORICET) [phone] MG per tablet 1 tablet  1 tablet Oral  Q6H PRN Val Riles, MD   1 tablet at 09/17/20 1351   heparin ADULT infusion 100 units/mL (25000 units/238m)  1,050 Units/hr Intravenous Continuous KVal Riles MD 10.5 mL/hr at 09/17/20 1020 1,050 Units/hr at 09/17/20 1020   methylPREDNISolone sodium succinate (SOLU-MEDROL) 1,000 mg in sodium chloride 0.9 % 50 mL IVPB  1,000 mg Intravenous Q24H KVal Riles MD 58 mL/hr at 09/17/20 1455 1,000 mg at 09/17/20 1455   morphine 2 MG/ML injection 2 mg  2 mg Intravenous Q2H PRN GElwyn Reach MD   2 mg at 09/17/20 0531   ondansetron (ZOFRAN) tablet 4 mg  4 mg Oral Q6H PRN GElwyn Reach MD   4 mg at 09/17/20 1351   Or   ondansetron (ZOFRAN) injection 4 mg  4 mg Intravenous Q6H PRN GElwyn Reach MD       oxyCODONE (Oxy IR/ROXICODONE) immediate release tablet 5 mg  5 mg Oral Q6H PRN KVal Riles MD   5 mg at 09/17/20 1019    OBJECTIVE: Vitals:   09/17/20 0807 09/17/20 1123  BP: 122/75 113/75  Pulse: (!) 58 63  Resp: 16 16  Temp: 98 F (36.7 C) 98 F (36.7 C)  SpO2: 100% 97%     Body mass index is 31.09 kg/m.    ECOG FS:0 - Asymptomatic  Physical Exam Constitutional:      Appearance: Normal appearance.  HENT:     Head: Normocephalic and atraumatic.  Eyes:     Pupils: Pupils are equal, round, and reactive to light.  Cardiovascular:     Rate and Rhythm: Normal rate and regular rhythm.     Heart sounds: Normal heart sounds. No murmur heard. Pulmonary:     Effort: Pulmonary effort is normal.     Breath sounds: Normal breath sounds. No wheezing.  Abdominal:     General: Bowel sounds are normal. There is no distension.     Palpations: Abdomen is soft.     Tenderness: There is no abdominal tenderness.  Musculoskeletal:        General: Normal range of motion.     Cervical back: Normal range of motion.  Skin:    General: Skin is warm and dry.     Findings: No rash.  Neurological:     Mental Status: She is alert and oriented to person, place, and time.  Psychiatric:         Judgment: Judgment normal.     LAB RESULTS:  Lab Results  Component Value Date   NA 137 09/17/2020   K 3.8 09/17/2020   CL 104 09/17/2020   CO2 22 09/17/2020   GLUCOSE 119 (H) 09/17/2020   BUN 8 09/17/2020   CREATININE 0.57 09/17/2020   CALCIUM 9.2 09/17/2020   PROT 7.4 09/17/2020   ALBUMIN 3.6 09/17/2020   AST 22 09/17/2020   ALT 35 09/17/2020   ALKPHOS 53 09/17/2020   BILITOT 0.7 09/17/2020   GFRNONAA >60 09/17/2020   GFRAA >60 02/02/2015    Lab Results  Component Value Date   WBC 11.3 (H) 09/17/2020   NEUTROABS 6.6 09/16/2020   HGB 13.1 09/17/2020   HCT 40.9 09/17/2020   MCV 84.0 09/17/2020   PLT 221  09/17/2020     STUDIES: MR Brain W and Wo Contrast  Result Date: 09/16/2020 CLINICAL DATA:  Right-sided head pain. History of multiple sclerosis Multiple sclerosis, new event EXAM: MRI HEAD WITHOUT AND WITH CONTRAST TECHNIQUE: Multiplanar, multiecho pulse sequences of the brain and surrounding structures were obtained without and with intravenous contrast. CONTRAST:  34m GADAVIST GADOBUTROL 1 MMOL/ML IV SOLN COMPARISON:  None. FINDINGS: Brain: No acute infarct, mass effect or extra-axial collection. There are 5-10 hyperintense T2-weighted signal lesions of the periventricular white matter. There is a contrast-enhancing lesion in the left frontal lobe (19:107). The midline structures are normal. Vascular: There is a large filling defect within the right transverse sinus that extends into the sigmoid sinus and internal jugular vein. Skull and upper cervical spine: Normal calvarium and skull base. Visualized upper cervical spine and soft tissues are normal. Sinuses/Orbits:No paranasal sinus fluid levels or advanced mucosal thickening. No mastoid or middle ear effusion. Normal orbits. IMPRESSION: 1. Contrast-enhancing lesion in the left frontal lobe consistent with active demyelination. 2. Thrombosis of the right transverse sinus, sigmoid sinus and internal jugular vein. 3.  Multiple chronic white matter lesions in a pattern consistent multiple sclerosis Electronically Signed   By: KUlyses JarredM.D.   On: 09/16/2020 22:50    ASSESSMENT: Ms. HKristis a 30year old female with recent diagnosis of cavernous sinus and right jugular thrombus and recent diagnosis of multiple sclerosis.   PLAN:    Cavernous sinus /jugular thrombus: Initially presented with headache and blurry vision X several weeks. Before hospitalization she was being worked up by neurology outpatient for MS. MRI showed lesion in frontal lobe consistent with active demyelination, thrombus of right transverse sinus, sigmoid sinus and internal jugular vein. She is currently on IV heparin and will transition to oral anticoagulation once discharged. Recommend checking thrombophilia labs-factor V Leiden, antiphospholipid panel, prothrombin gene and protein S and protein C deficiency.  Orders placed. Dr. BRogue Bussingwill round on patient tomorrow.  If thrombophilia work-up is negative blood clots likely multifactorial due to MS, oral contraception, obesity and sedentary lifestyle.   Multiple sclerosis: She is currently on IV steroids while hospitalized. She will have MRI of her cervical and thoracic spine She will follow-up outpatient with neurology.  Disposition: Continue heparin and will transition to Xarelto/Eliquis prior to discharge. She will need to follow-up with hematology as an outpatient.  I spent 25 minutes dedicated to the care of this patient (face-to-face and non-face-to-face) on the date of the encounter to include what is described in the assessment and plan.  Patient expressed understanding and was in agreement with this plan. She also understands that She can call clinic at any time with any questions, concerns, or complaints.   Cancer Staging No matching staging information was found for the patient.  JJacquelin Hawking NP   09/17/2020 4:17 PM  Dr. BRogue Bussingsaw this patient as  well. Note to follow.

## 2020-09-17 NOTE — ED Notes (Signed)
Patient moved to [facility] pending admission. Patient and significant other updated on POC and potential wait for bed assignment. Patient awaiting tele-neurology consult and inpatient hospitalist evaluation.

## 2020-09-17 NOTE — ED Notes (Signed)
Teleneurology cart at bedside  

## 2020-09-17 NOTE — Progress Notes (Signed)
[facility] Progress Note  Patient: Sophia Hampton    ZOX:[id number]  DOA: 09/16/2020     Date of Service: the patient was seen and examined on 09/17/2020  Chief Complaint  Patient presents with   Headache   Brief hospital course: Sophia Hampton is a 30 y.o. female with medical history significant of no significant past medical history except for depression hyperlipidemia and insomnia who has been experiencing headaches especially in the right side of the head vertigo, blurry vision in the right eye.  She was seen recently and apparently sent to a neurologist who evaluated her and suspected multiple sclerosis.  Patient is awaiting insurance authorization for outpatient MRI but the headache is getting worse.  She has been taking over-the-counter medications including NSAIDs with no relief.  Patient therefore came to the ER today for evaluation.  MRI of the brain was performed showing progressive demyelination most likely consistent with multiple sclerosis.  Additionally showed cavernous sinus thrombosis including right internal jugular vein.  Patient is therefore being admitted to the hospital for management.  Neurology being consulted as well..   ED Course: Vitals as well as CBC chemistry all within normal.  MRI of the brain showed contrast-enhancing lesion in the left frontal lobe consistent with active demyelination.  There are multiple chronic white matter lesions in a pattern consistent with multiple sclerosis.  Patient also has thrombosis of the right transverse sinus sigmoid sinus and internal jugular veins.  Patient initiated on IV heparin.  Being admitted to the hospital for evaluation and treatment.   Assessment and Plan: Principal Problem:   Multiple sclerosis (HCC) Active Problems:   Depression   Hyperlipidemia   Insomnia   Cavernous sinus thrombosis       # Cavernous sinus thrombosis: Patient has thrombosis of the right transverse sinus, sigmoid sinus and internal jugular vein.    Discontinue OCP, continue current medication for headache Continue IV heparin.   F/u Hematology consultation.  Work-up for possible thromboembolic phenomena  We will transition to DOAC after hematology consult  # Multiple sclerosis: New diagnosis.   Patient was seen by telemetry neurologist, recommended IV Solu-Medrol 1000 mg daily for 5 days, follow-up with neurology as an outpatient for immunomodulating regime MRI brain: Contrast-enhancing lesion in the left frontal lobe consistent with active demyelination. Multiple chronic white matter lesions in a pattern consistent multiple sclerosis Follow MRI C and T-spine    # depression: We will confirm home regimen and continue   # hyperlipidemia: Not on a statin.  Continue outpatient management    Body mass index is 31.09 kg/m.  Interventions:      Diet: Regular DVT Prophylaxis: Therapeutic Anticoagulation with IV heparin infusion    Advance goals of care discussion: Full code  Family Communication: family was present at bedside, at the time of interview.  The pt provided permission to discuss medical plan with the family. Opportunity was given to ask question and all questions were answered satisfactorily.   Disposition:  Pt is from Home, admitted with Head ache, new onset MS and cavernous sinus thrombus, still has headache and she is on IV heparin infusion, which precludes a safe discharge. Discharge to home, when cleared by neurology and hematology..  Subjective: No overnight active issues, patient was admitted last night due to headache, right-sided blurry vision and she was diagnosed with new MS and cavernous sinus thrombus.  Pain medications were given, headache is improving.  Denies any other neurological symptoms.  Physical Exam: General:  alert oriented to  time, place, and person.  Appear in mild distress, affect appropriate Eyes: PERRLA ENT: Oral Mucosa Clear, moist  Neck: no JVD,  Cardiovascular: S1 and S2 Present,  no Murmur,  Respiratory: good respiratory effort, Bilateral Air entry equal and Decreased, no Crackles, no wheezes Abdomen: Bowel Sound present, Soft and no tenderness,  Skin: no rashes Extremities: no Pedal edema, no calf tenderness Neurologic: without any new focal findings Gait not checked due to patient safety concerns  Vitals:   09/17/20 0143 09/17/20 0535 09/17/20 0807 09/17/20 1123  BP: 120/83 110/66 122/75 113/75  Pulse: 70 64 (!) 58 63  Resp: 17 16 16 16   Temp: 98.2 F (36.8 C) 97.6 F (36.4 C) 98 F (36.7 C) 98 F (36.7 C)  TempSrc:      SpO2: 100% 100% 100% 97%  Weight:      Height:        Intake/Output Summary (Last 24 hours) at 09/17/2020 1332 Last data filed at 09/17/2020 0535 Gross per 24 hour  Intake 240 ml  Output 0 ml  Net 240 ml   Filed Weights   09/16/20 1926  Weight: 77.1 kg    Data Reviewed: I have personally reviewed and interpreted daily labs, tele strips, imagings as discussed above. I reviewed all nursing notes, pharmacy notes, vitals, pertinent old records I have discussed plan of care as described above with RN and patient/family.  CBC: Recent Labs  Lab 09/16/20 2115 09/17/20 0433  WBC 9.8 11.3*  NEUTROABS 6.6  --   HGB 14.0 13.1  HCT 42.2 40.9  MCV 83.1 84.0  PLT 213 221   Basic Metabolic Panel: Recent Labs  Lab 09/16/20 2115 09/17/20 0433  NA 137 137  K 3.7 3.8  CL 104 104  CO2 24 22  GLUCOSE 92 119*  BUN 10 8  CREATININE 0.50 0.57  CALCIUM 9.4 9.2    Studies: MR Brain W and Wo Contrast  Result Date: 09/16/2020 CLINICAL DATA:  Right-sided head pain. History of multiple sclerosis Multiple sclerosis, new event EXAM: MRI HEAD WITHOUT AND WITH CONTRAST TECHNIQUE: Multiplanar, multiecho pulse sequences of the brain and surrounding structures were obtained without and with intravenous contrast. CONTRAST:  48mL GADAVIST GADOBUTROL 1 MMOL/ML IV SOLN COMPARISON:  None. FINDINGS: Brain: No acute infarct, mass effect or  extra-axial collection. There are 5-10 hyperintense T2-weighted signal lesions of the periventricular white matter. There is a contrast-enhancing lesion in the left frontal lobe (19:107). The midline structures are normal. Vascular: There is a large filling defect within the right transverse sinus that extends into the sigmoid sinus and internal jugular vein. Skull and upper cervical spine: Normal calvarium and skull base. Visualized upper cervical spine and soft tissues are normal. Sinuses/Orbits:No paranasal sinus fluid levels or advanced mucosal thickening. No mastoid or middle ear effusion. Normal orbits. IMPRESSION: 1. Contrast-enhancing lesion in the left frontal lobe consistent with active demyelination. 2. Thrombosis of the right transverse sinus, sigmoid sinus and internal jugular vein. 3. Multiple chronic white matter lesions in a pattern consistent multiple sclerosis Electronically Signed   By: 11m M.D.   On: 09/16/2020 22:50    Scheduled Meds: Continuous Infusions:  sodium chloride 100 mL/hr at 09/17/20 1200   heparin 1,050 Units/hr (09/17/20 1020)   PRN Meds: acetaminophen, butalbital-acetaminophen-caffeine, morphine injection, ondansetron **OR** ondansetron (ZOFRAN) IV, oxyCODONE  Time spent: 35 minutes  Author: 09/19/20. MD [facility] 09/17/2020 1:32 PM  To reach On-call, see care teams to locate the attending and reach out  to them via [email or web address]. If 7PM-7AM, please contact night-coverage If you still have difficulty reaching the attending provider, please page the [facility] (Director on Call) for [facility] on amion for assistance.

## 2020-09-17 NOTE — Progress Notes (Signed)
ANTICOAGULATION CONSULT NOTE  Pharmacy Consult for Heparin  Indication:  sinus thromboembolism  No Known Allergies  Patient Measurements: Height: 5\' 2"  (157.5 cm) Weight: 77.1 kg (170 lb) IBW/kg (Calculated) : 50.1 Heparin Dosing Weight:  67 kg   Vital Signs: Temp: 98.3 F (36.8 C) (07/21 1621) BP: 104/59 (07/21 1621) Pulse Rate: 57 (07/21 1621)  Labs: Recent Labs    09/16/20 2115 09/16/20 2355 09/17/20 0433 09/17/20 0800 09/17/20 1606  HGB 14.0  --  13.1  --   --   HCT 42.2  --  40.9  --   --   PLT 213  --  221  --   --   APTT  --  24  --   --   --   LABPROT  --  13.7  --   --   --   INR  --  1.1  --   --   --   HEPARINUNFRC  --   --   --  0.75* 0.44  CREATININE 0.50  --  0.57  --   --      Estimated Creatinine Clearance: 99.8 mL/min (by C-G formula based on SCr of 0.57 mg/dL).   Medical History: No past medical history on file.    Assessment: Pharmacy consulted to dose heparin in this 30 year old female admitted with Sinus thromboembolism.  No prior anticoag noted.   7/21 1606 HL = 0.44 therapeutic x 1 @ 1050 units/hr  Goal of Therapy:  HL:  0.3 - 0.7 Monitor platelets by anticoagulation protocol: Yes   Plan:  Heparin level therapeutic Continue heparin infusion rate at 1050 units/hr Recheck HL at 2200 CBC daily while on heparin infusion  8/21, PharmD, BCPS 09/17/2020,4:29 PM

## 2020-09-17 NOTE — Progress Notes (Signed)
ANTICOAGULATION CONSULT NOTE - Initial Consult  Pharmacy Consult for Heparin  Indication:  sinus thromboembolism  No Known Allergies  Patient Measurements: Height: 5\' 2"  (157.5 cm) Weight: 77.1 kg (170 lb) IBW/kg (Calculated) : 50.1 Heparin Dosing Weight:  67 kg   Vital Signs: Temp: 98.2 F (36.8 C) (07/21 0143) Temp Source: Oral (07/21 0100) BP: 120/83 (07/21 0143) Pulse Rate: 70 (07/21 0143)  Labs: Recent Labs    09/16/20 2115 09/16/20 2355  HGB 14.0  --   HCT 42.2  --   PLT 213  --   APTT  --  24  LABPROT  --  13.7  INR  --  1.1  CREATININE 0.50  --     Estimated Creatinine Clearance: 99.8 mL/min (by C-G formula based on SCr of 0.5 mg/dL).   Medical History: No past medical history on file.  Medications:  Medications Prior to Admission  Medication Sig Dispense Refill Last Dose   sertraline (ZOLOFT) 50 MG tablet Take 50 mg by mouth daily.   Past Week   SRONYX 0.1-20 MG-MCG tablet Take 1 tablet by mouth daily.   Past Week   Turmeric (QC TUMERIC COMPLEX PO) Take 15 mLs by mouth at bedtime.   Past Week    Assessment: Pharmacy consulted to dose heparin in this 30 year old female admitted with Sinus thromboembolism.  No prior anticoag noted.  CrCl = 99.8 ml/min   Goal of Therapy:  HL:  0.3 - 0.7 Monitor platelets by anticoagulation protocol: Yes   Plan:  Give 5000 units bolus x 1 Start heparin infusion at 1200 units/hr Check anti-Xa level in 6 hours and daily while on heparin Continue to monitor H&H and platelets  Sallee Hogrefe D 09/17/2020,2:00 AM

## 2020-09-17 NOTE — ED Provider Notes (Signed)
-----------------------------------------   12:55 AM on 09/17/2020 -----------------------------------------   Spoke with teleneurologist who agrees with heparin, recommends Solumedrol 1g IV qd x 5 days, and MRI cervical and thoracic spine w and w/o contrast. Have relayed these recommendations to hospitalist Dr. Mikeal Hawthorne.   Irean Hong, MD 09/17/20 [phone]

## 2020-09-17 NOTE — Plan of Care (Signed)
Pt admitted to unit. Pain well-managed with pain medication.  Problem: Education: Goal: Knowledge of General Education information will improve Description: Including pain rating scale, medication(s)/side effects and non-pharmacologic comfort measures Outcome: Progressing   Problem: Health Behavior/Discharge Planning: Goal: Ability to manage health-related needs will improve Outcome: Progressing   Problem: Clinical Measurements: Goal: Ability to maintain clinical measurements within normal limits will improve Outcome: Progressing Goal: Will remain free from infection Outcome: Progressing Goal: Diagnostic test results will improve Outcome: Progressing   Problem: Coping: Goal: Level of anxiety will decrease Outcome: Progressing   Problem: Elimination: Goal: Will not experience complications related to bowel motility Outcome: Progressing Goal: Will not experience complications related to urinary retention Outcome: Progressing   Problem: Pain Managment: Goal: General experience of comfort will improve Outcome: Progressing   Problem: Skin Integrity: Goal: Risk for impaired skin integrity will decrease Outcome: Progressing

## 2020-09-17 NOTE — ED Notes (Signed)
Tele neuro doctor on monitor at bedside

## 2020-09-17 NOTE — Progress Notes (Signed)
ANTICOAGULATION CONSULT NOTE  Pharmacy Consult for Heparin  Indication:  sinus thromboembolism  No Known Allergies  Patient Measurements: Height: 5\' 2"  (157.5 cm) Weight: 77.1 kg (170 lb) IBW/kg (Calculated) : 50.1 Heparin Dosing Weight:  67 kg   Vital Signs: Temp: 98 F (36.7 C) (07/21 0807) Temp Source: Oral (07/21 0100) BP: 122/75 (07/21 0807) Pulse Rate: 58 (07/21 0807)  Labs: Recent Labs    09/16/20 2115 09/16/20 2355 09/17/20 0433 09/17/20 0800  HGB 14.0  --  13.1  --   HCT 42.2  --  40.9  --   PLT 213  --  221  --   APTT  --  24  --   --   LABPROT  --  13.7  --   --   INR  --  1.1  --   --   HEPARINUNFRC  --   --   --  0.75*  CREATININE 0.50  --  0.57  --      Estimated Creatinine Clearance: 99.8 mL/min (by C-G formula based on SCr of 0.57 mg/dL).   Medical History: No past medical history on file.    Assessment: Pharmacy consulted to dose heparin in this 30 year old female admitted with Sinus thromboembolism.  No prior anticoag noted.    Goal of Therapy:  HL:  0.3 - 0.7 Monitor platelets by anticoagulation protocol: Yes   Plan:  Heparin level supratherapeutic Decrease heparin infusion rate to 1050 units/hr Recheck HL at 1600 CBC daily while on heparin infusion  37, PharmD, BCPS 09/17/2020,8:50 AM

## 2020-09-17 NOTE — Progress Notes (Signed)
ANTICOAGULATION CONSULT NOTE  Pharmacy Consult for Heparin  Indication:  sinus thromboembolism  No Known Allergies  Patient Measurements: Height: 5\' 2"  (157.5 cm) Weight: 77.1 kg (170 lb) IBW/kg (Calculated) : 50.1 Heparin Dosing Weight:  67 kg   Vital Signs: Temp: 98.1 F (36.7 C) (07/21 2005) Temp Source: Oral (07/21 2005) BP: 113/84 (07/21 2005) Pulse Rate: 77 (07/21 2005)  Labs: Recent Labs    09/16/20 2115 09/16/20 2355 09/17/20 0433 09/17/20 0800 09/17/20 1606 09/17/20 2214  HGB 14.0  --  13.1  --   --   --   HCT 42.2  --  40.9  --   --   --   PLT 213  --  221  --   --   --   APTT  --  24  --   --   --   --   LABPROT  --  13.7  --   --   --   --   INR  --  1.1  --   --   --   --   HEPARINUNFRC  --   --   --  0.75* 0.44 0.32  CREATININE 0.50  --  0.57  --   --   --      Estimated Creatinine Clearance: 99.8 mL/min (by C-G formula based on SCr of 0.57 mg/dL).   Medical History: No past medical history on file.    Assessment: Pharmacy consulted to dose heparin in this 30 year old female admitted with Sinus thromboembolism.  No prior anticoag noted.   7/21 1606 HL = 0.44 therapeutic x 1 @ 1050 units/hr  Goal of Therapy:  HL:  0.3 - 0.7 Monitor platelets by anticoagulation protocol: Yes   Plan:  7/21:  HL @ 2214 = 0.32, therapeutic X 2 Will continue pt on current rate and recheck HL on 7/22 with AM labs.   Tishie Altmann D, PharmD 09/17/2020,11:19 PM

## 2020-09-18 LAB — LIPID PANEL
Cholesterol: 193 mg/dL (ref 0–200)
HDL: 50 mg/dL (ref >40)
LDL Cholesterol: 127 mg/dL — ABNORMAL HIGH (ref 0–99)
Total CHOL/HDL Ratio: 3.9 RATIO
Triglycerides: 81 mg/dL (ref <150)
VLDL: 16 mg/dL (ref 0–40)

## 2020-09-18 LAB — CBC
HCT: 38.7 % (ref 36.0–46.0)
Hemoglobin: 12.6 g/dL (ref 12.0–15.0)
MCH: 26.6 pg (ref 26.0–34.0)
MCHC: 32.6 g/dL (ref 30.0–36.0)
MCV: 81.6 fL (ref 80.0–100.0)
Platelets: 195 10*3/uL (ref 150–400)
RBC: 4.74 MIL/uL (ref 3.87–5.11)
RDW: 12.6 % (ref 11.5–15.5)
WBC: 7.4 10*3/uL (ref 4.0–10.5)
nRBC: 0 % (ref 0.0–0.2)

## 2020-09-18 LAB — BASIC METABOLIC PANEL WITH GFR
Anion gap: 5 (ref 5–15)
BUN: 8 mg/dL (ref 6–20)
CO2: 25 mmol/L (ref 22–32)
Calcium: 9.1 mg/dL (ref 8.9–10.3)
Chloride: 107 mmol/L (ref 98–111)
Creatinine, Ser: 0.6 mg/dL (ref 0.44–1.00)
GFR, Estimated: >60 mL/min
Glucose, Bld: 150 mg/dL — ABNORMAL HIGH (ref 70–99)
Potassium: 4.4 mmol/L (ref 3.5–5.1)
Sodium: 137 mmol/L (ref 135–145)

## 2020-09-18 LAB — VITAMIN D 25 HYDROXY (VIT D DEFICIENCY, FRACTURES): Vit D, 25-Hydroxy: 40.53 ng/mL (ref 30–100)

## 2020-09-18 LAB — PHOSPHORUS: Phosphorus: 3.8 mg/dL (ref 2.5–4.6)

## 2020-09-18 LAB — MAGNESIUM: Magnesium: 1.8 mg/dL (ref 1.7–2.4)

## 2020-09-18 LAB — HEPARIN LEVEL (UNFRACTIONATED): Heparin Unfractionated: 0.36 IU/mL (ref 0.30–0.70)

## 2020-09-18 MED ORDER — ENOXAPARIN SODIUM 80 MG/0.8ML IJ SOSY
1.0000 mg/kg | PREFILLED_SYRINGE | Freq: Two times a day (BID) | INTRAMUSCULAR | Status: DC
  Filled 2020-09-18: qty 0.78

## 2020-09-18 MED ORDER — ENSURE MAX PROTEIN PO LIQD
11.0000 [oz_av] | Freq: Two times a day (BID) | ORAL | Status: DC
  Administered 2020-09-19 – 2020-09-21 (×4): 11 [oz_av] via ORAL
  Filled 2020-09-18: qty 330

## 2020-09-18 MED ORDER — HEPARIN (PORCINE) 25000 UT/250ML-% IV SOLN: 1050.0000 [IU]/h | INTRAVENOUS | Status: DC

## 2020-09-18 MED ORDER — ADULT MULTIVITAMIN W/MINERALS CH
1.0000 | ORAL_TABLET | Freq: Every day | ORAL | Status: DC
  Administered 2020-09-18 – 2020-09-21 (×4): 1 via ORAL
  Filled 2020-09-18 (×4): qty 1

## 2020-09-18 MED ORDER — DABIGATRAN ETEXILATE MESYLATE 150 MG PO CAPS
150.0000 mg | ORAL_CAPSULE | Freq: Two times a day (BID) | ORAL | Status: DC
  Filled 2020-09-18 (×2): qty 1

## 2020-09-18 MED ORDER — ZOLPIDEM TARTRATE 5 MG PO TABS
5.0000 mg | ORAL_TABLET | Freq: Every evening | ORAL | Status: DC | PRN
  Administered 2020-09-18 – 2020-09-20 (×4): 5 mg via ORAL
  Filled 2020-09-18 (×4): qty 1

## 2020-09-18 MED ORDER — APIXABAN 5 MG PO TABS: 5.0000 mg | ORAL_TABLET | Freq: Two times a day (BID) | ORAL | Status: DC

## 2020-09-18 MED ORDER — APIXABAN 5 MG PO TABS: 10.0000 mg | ORAL_TABLET | Freq: Two times a day (BID) | ORAL | Status: DC

## 2020-09-18 MED ORDER — HEPARIN (PORCINE) 25000 UT/250ML-% IV SOLN
1050.0000 [IU]/h | INTRAVENOUS | Status: DC
  Administered 2020-09-18: 1050 [IU]/h via INTRAVENOUS
  Filled 2020-09-18: qty 250

## 2020-09-18 NOTE — Progress Notes (Addendum)
Subjective: Headache is significantly improved  Exam: Vitals:   09/18/20 0433 09/18/20 0746  BP: 118/73 103/67  Pulse: 62 61  Resp: 17 16  Temp: 97.6 F (36.4 C) 98 F (36.7 C)  SpO2: 99% 100%   Gen: In bed, NAD Resp: non-labored breathing, no acute distress Abd: soft, nt  Neuro: MS: Awake, alert, interactive and appropriate CN: Pupils equal round and reactive to light, visual fields full, though she does report some spots when looking out of her right eye there is a very mild afferent pupillary defect of the right eye Motor: No drift Sensory: Intact light touch DTR: 2+ and symmetric at the biceps, knees, ankles toes downgoing  Pertinent Labs: Initial INR and PTT were normal. Factor V Leiden, prothrombin, antiphospholipid, protein C&S are pending ANA, anti-ENA(including SSA and SSB), antiphospholipid panel pending  Impression: 30 year old female with a history of neurological disturbances since last December who presents with headache since Sunday.  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  Recommendations: 1) can change from heparin to Pradaxa for CVT on discharge 2) Solu-Medrol 1 g daily for total of 5 days 3) neurology will follow  Ritta Slot, MD [facility] [phone]  If 7pm- 7am, please page neurology on call as listed in AMION.

## 2020-09-18 NOTE — Consult Note (Addendum)
Neurology Consultation Reason for Consult: MS Referring Physician: Gillis Santa  CC: Headache  History is obtained from: Patient  HPI: Aneliese Beaudry is a 30 y.o. female with a history of neurological symptoms since December of this year.  She describes an episode of facial numbness, subsequently in March she had bilateral foot numbness and tingling.  Most recently, she has had some pain in her right eye with blurred vision and she states that the right upper part of her right eye is difficult to make out.  This started almost a month ago, but because of the headache she presented to the emergency department yesterday where she was seen by teleneurology and had an MRI of her brain which demonstrates changes consistent with MS with a single enhancing lesion.  She also had thrombus in the right transverse sinus, sigmoid sinus and IJ.  She has a history of migraines, but the headache that she has had over the past 2 days has been more intense and different than her previous headaches.  She also describes some neck pain yesterday.    ROS: A 14 point ROS was performed and is negative except as noted in the HPI.   Past medical history: None  Family medical history: No history of similar  Social History:  reports that she has quit smoking. She has never used smokeless tobacco. She reports current alcohol use. She reports current drug use. Drug: Marijuana.  Exam: Current vital signs: BP 103/67 (BP Location: Right Arm)   Pulse 61   Temp 98 F (36.7 C) (Oral)   Resp 16   Ht 5\' 2"  (1.575 m)   Wt 77.1 kg   LMP 08/26/2020   SpO2 100%   BMI 31.09 kg/m  Vital signs in last 24 hours: Temp:  [97.6 F (36.4 C)-98.3 F (36.8 C)] 98 F (36.7 C) (07/22 0746) Pulse Rate:  [57-77] 61 (07/22 0746) Resp:  [15-17] 16 (07/22 0746) BP: (103-120)/(59-84) 103/67 (07/22 0746) SpO2:  [97 %-100 %] 100 % (07/22 0746)   Physical Exam  Constitutional: Appears well-developed and well-nourished.  Psych:  Affect appropriate to situation Eyes: No scleral injection HENT: No OP obstruction MSK: no joint deformities.  Cardiovascular: Normal rate and regular rhythm.  Respiratory: Effort normal, non-labored breathing GI: Soft.  No distension. There is no tenderness.  Skin: WDI  Neuro: Mental Status: Patient is awake, alert, oriented to person, place, month, year, and situation. Patient is able to give a clear and coherent history. No signs of aphasia or neglect Cranial Nerves: II: She has decreased vision in the upper temporal field of her right eye. Pupils are equal, round, and reactive to light, but there is a very mild afferent pupillary defect on the right III,IV, VI: EOMI without ptosis or diploplia.  V: Facial sensation is symmetric to temperature VII: Facial movement is symmetric.  VIII: hearing is intact to voice X: Uvula elevates symmetrically XI: Shoulder shrug is symmetric. XII: tongue is midline without atrophy or fasciculations.  Motor: Tone is normal. Bulk is normal. 5/5 strength was present in all four extremities.  Sensory: Sensation is symmetric to light touch and temperature in the arms and legs. Cerebellar: FNF and HKS are intact bilaterally   I have reviewed labs in epic and the results pertinent to this consultation are: BMP unremarkable Hypercoagulable panel pending  I have reviewed the images obtained: MRI brain-changes consistent with MS as well as thrombosis of the right jugular  Impression: 30 year old female with two distinct problems.  She  has both enhancing and nonenhancing lesions most consistent with multiple sclerosis as well as a large vein thrombosis.  I agree with sending hypercoagulable labs, and continuing heparin through tomorrow.  If she is stable clinically through tomorrow, we could change her over to a DOAC.  With evidence of optic neuritis on exam, she will need 5 days of intravenous Solu-Medrol.  Recommendations: 1) Solu-Medrol 1 g daily  for 5 days 2) if stable clinically, can change from heparin to DOAC tomorrow 3) she will likely need to be started on a disease modifying therapy as an outpatient 4) I will send ANA SSA, SSB, Smith 5) agree with cessation of birth control  6) neurology to follow   Ritta Slot, MD [facility] [phone]  If 7pm- 7am, please page neurology on call as listed in AMION.

## 2020-09-18 NOTE — Plan of Care (Signed)
Pt having a difficult time sleeping this shift. Ambien ordered. Problem: Education: Goal: Knowledge of General Education information will improve Description: Including pain rating scale, medication(s)/side effects and non-pharmacologic comfort measures Outcome: Progressing   Problem: Health Behavior/Discharge Planning: Goal: Ability to manage health-related needs will improve Outcome: Progressing   Problem: Clinical Measurements: Goal: Ability to maintain clinical measurements within normal limits will improve Outcome: Progressing Goal: Will remain free from infection Outcome: Progressing Goal: Diagnostic test results will improve Outcome: Progressing Goal: Respiratory complications will improve Outcome: Progressing Goal: Cardiovascular complication will be avoided Outcome: Progressing   Problem: Activity: Goal: Risk for activity intolerance will decrease Outcome: Progressing   Problem: Nutrition: Goal: Adequate nutrition will be maintained Outcome: Progressing   Problem: Coping: Goal: Level of anxiety will decrease Outcome: Progressing   Problem: Elimination: Goal: Will not experience complications related to bowel motility Outcome: Progressing Goal: Will not experience complications related to urinary retention Outcome: Progressing   Problem: Pain Managment: Goal: General experience of comfort will improve Outcome: Progressing   Problem: Safety: Goal: Ability to remain free from injury will improve Outcome: Progressing   Problem: Skin Integrity: Goal: Risk for impaired skin integrity will decrease Outcome: Progressing

## 2020-09-18 NOTE — Progress Notes (Signed)
ANTICOAGULATION CONSULT NOTE  Pharmacy Consult for Heparin  Indication:  sinus thromboembolism  No Known Allergies  Patient Measurements: Height: 5\' 2"  (157.5 cm) Weight: 77.1 kg (170 lb) IBW/kg (Calculated) : 50.1 Heparin Dosing Weight:  67 kg   Vital Signs: Temp: 97.6 F (36.4 C) (07/22 0433) Temp Source: Oral (07/22 0433) BP: 118/73 (07/22 0433) Pulse Rate: 62 (07/22 0433)  Labs: Recent Labs    09/16/20 2115 09/16/20 2355 09/17/20 0433 09/17/20 0800 09/17/20 1606 09/17/20 2214 09/18/20 0522  HGB 14.0  --  13.1  --   --   --  12.6  HCT 42.2  --  40.9  --   --   --  38.7  PLT 213  --  221  --   --   --  195  APTT  --  24  --   --   --   --   --   LABPROT  --  13.7  --   --   --   --   --   INR  --  1.1  --   --   --   --   --   HEPARINUNFRC  --   --   --    < > 0.44 0.32 0.36  CREATININE 0.50  --  0.57  --   --   --   --    < > = values in this interval not displayed.     Estimated Creatinine Clearance: 99.8 mL/min (by C-G formula based on SCr of 0.57 mg/dL).   Medical History: No past medical history on file.    Assessment: Pharmacy consulted to dose heparin in this 30 year old female admitted with Sinus thromboembolism.  No prior anticoag noted.   7/21 1606 HL = 0.44 therapeutic x 1 @ 1050 units/hr  Goal of Therapy:  HL:  0.3 - 0.7 Monitor platelets by anticoagulation protocol: Yes   Plan:  7/22:  HL @ 0522 = 0.36 Will continue pt on current rate and recheck HL on 7/23 with AM labs.   Rivky Clendenning D, PharmD 09/18/2020,6:03 AM

## 2020-09-18 NOTE — Progress Notes (Addendum)
[facility] Progress Note  Patient: Sophia Hampton    QQI:[id number]  DOA: 09/16/2020     Date of Service: the patient was seen and examined on 09/18/2020  Chief Complaint  Patient presents with   Headache   Brief hospital course: Sophia Hampton is a 30 y.o. female with medical history significant of no significant past medical history except for depression hyperlipidemia and insomnia who has been experiencing headaches especially in the right side of the head vertigo, blurry vision in the right eye.  She was seen recently and apparently sent to a neurologist who evaluated her and suspected multiple sclerosis.  Patient is awaiting insurance authorization for outpatient MRI but the headache is getting worse.  She has been taking over-the-counter medications including NSAIDs with no relief.  Patient therefore came to the ER today for evaluation.  MRI of the brain was performed showing progressive demyelination most likely consistent with multiple sclerosis.  Additionally showed cavernous sinus thrombosis including right internal jugular vein.  Patient is therefore being admitted to the hospital for management.  Neurology being consulted as well..   ED Course: Vitals as well as CBC chemistry all within normal.  MRI of the brain showed contrast-enhancing lesion in the left frontal lobe consistent with active demyelination.  There are multiple chronic white matter lesions in a pattern consistent with multiple sclerosis.  Patient also has thrombosis of the right transverse sinus sigmoid sinus and internal jugular veins.  Patient initiated on IV heparin.  Being admitted to the hospital for evaluation and treatment.   Assessment and Plan: Principal Problem:   Multiple sclerosis (HCC) Active Problems:   Depression   Hyperlipidemia   Insomnia   Cavernous sinus thrombosis       # Cavernous sinus thrombosis: Patient has thrombosis of the right transverse sinus, sigmoid sinus and internal jugular vein.    Discontinue OCP, continue current medication for headache Continue IV heparin.   Hematology consulted, hypercoagulable work-up is pending  Condition to DOAC tomorrow a.m.   # Multiple sclerosis: New diagnosis.   Patient was seen by telemetry neurologist, recommended IV Solu-Medrol 1000 mg daily for 5 days, follow-up with neurology as an outpatient for immunomodulating regime MRI brain: Contrast-enhancing lesion in the left frontal lobe consistent with active demyelination. Multiple chronic white matter lesions in a pattern consistent multiple sclerosis MRI C and T-spine negative, no acute findings     # Depression: We will confirm home regimen and continue   # Hyperlipidemia: Not on a statin.  Continue outpatient management    Body mass index is 31.09 kg/m.  Interventions:      Diet: Regular DVT Prophylaxis: Therapeutic Anticoagulation with IV heparin infusion    Advance goals of care discussion: Full code  Family Communication: family was present at bedside, at the time of interview.  The pt provided permission to discuss medical plan with the family. Opportunity was given to ask question and all questions were answered satisfactorily.   Disposition:  Pt is from Home, admitted with Head ache, new onset MS and cavernous sinus thrombus, she is on IV heparin infusion and IV Solu-Medrol, which precludes a safe discharge. Discharge to home on Monday after 5 days treatment of IV Solu-Medrol    Subjective: No overnight active issues, patient is feeling better, headache is resolved, no blurry vision.  Denies any other neurological symptoms.  Overall patient is improving.   Physical Exam: General:  alert oriented to time, place, and person.  Appear in mild distress, affect appropriate  Eyes: PERRLA ENT: Oral Mucosa Clear, moist  Neck: no JVD,  Cardiovascular: S1 and S2 Present, no Murmur,  Respiratory: good respiratory effort, Bilateral Air entry equal and Decreased, no  Crackles, no wheezes Abdomen: Bowel Sound present, Soft and no tenderness,  Skin: no rashes Extremities: no Pedal edema, no calf tenderness Neurologic: without any new focal findings Gait not checked due to patient safety concerns  Vitals:   09/17/20 1840 09/17/20 2005 09/18/20 0433 09/18/20 0746  BP: 120/72 113/84 118/73 103/67  Pulse: 63 77 62 61  Resp: 16 17 17 16   Temp:  98.1 F (36.7 C) 97.6 F (36.4 C) 98 F (36.7 C)  TempSrc:  Oral Oral Oral  SpO2: 99% 98% 99% 100%  Weight:      Height:        Intake/Output Summary (Last 24 hours) at 09/18/2020 1529 Last data filed at 09/18/2020 0300 Gross per 24 hour  Intake 2021.53 ml  Output --  Net 2021.53 ml   Filed Weights   09/16/20 1926  Weight: 77.1 kg    Data Reviewed: I have personally reviewed and interpreted daily labs, tele strips, imagings as discussed above. I reviewed all nursing notes, pharmacy notes, vitals, pertinent old records I have discussed plan of care as described above with RN and patient/family.  CBC: Recent Labs  Lab 09/16/20 2115 09/17/20 0433 09/18/20 0522  WBC 9.8 11.3* 7.4  NEUTROABS 6.6  --   --   HGB 14.0 13.1 12.6  HCT 42.2 40.9 38.7  MCV 83.1 84.0 81.6  PLT 213 221 195   Basic Metabolic Panel: Recent Labs  Lab 09/16/20 2115 09/17/20 0433 09/18/20 0522  NA 137 137 137  K 3.7 3.8 4.4  CL 104 104 107  CO2 24 22 25   GLUCOSE 92 119* 150*  BUN 10 8 8   CREATININE 0.50 0.57 0.60  CALCIUM 9.4 9.2 9.1  MG  --   --  1.8  PHOS  --   --  3.8    Studies: MR Cervical Spine W or Wo Contrast  Result Date: 09/17/2020 CLINICAL DATA:  Demyelinating disease. EXAM: MRI CERVICAL AND THORACIC SPINE WITHOUT AND WITH CONTRAST TECHNIQUE: Multiplanar and multiecho pulse sequences of the cervical spine, to include the craniocervical junction and cervicothoracic junction, and the thoracic spine, were obtained without and with intravenous contrast. CONTRAST:  63mL GADAVIST GADOBUTROL 1 MMOL/ML IV  SOLN COMPARISON:  None. FINDINGS: MRI CERVICAL SPINE FINDINGS Alignment: Physiologic.  Reversal of normal lordotic curvature. Vertebrae: No fracture, evidence of discitis, or bone lesion. Cord: Normal signal and morphology. Posterior Fossa, vertebral arteries, paraspinal tissues: Negative. Disc levels: C1-2: Unremarkable. C2-3: Normal disc space and facet joints. There is no spinal canal stenosis. No neural foraminal stenosis. C3-4: Normal disc space and facet joints. There is no spinal canal stenosis. No neural foraminal stenosis. C4-5: Small central disc protrusion. There is no spinal canal stenosis. No neural foraminal stenosis. C5-6: Small left subarticular disc protrusion. There is no spinal canal stenosis. No neural foraminal stenosis. C6-7: Minimal disc bulge. There is no spinal canal stenosis. No neural foraminal stenosis. C7-T1: Normal disc space and facet joints. There is no spinal canal stenosis. No neural foraminal stenosis. MRI THORACIC SPINE FINDINGS Alignment:  Physiologic. Vertebrae: No fracture, evidence of discitis, or bone lesion. Cord:  Normal signal and morphology. Paraspinal and other soft tissues: Negative. Disc levels: No spinal canal or neural foraminal stenosis. IMPRESSION: 1. No evidence of demyelinating disease of the cervical or thoracic spine. 2.  Mild cervical degenerative disc disease without spinal canal or neural foraminal stenosis. Electronically Signed   By: Deatra Robinson M.D.   On: 09/17/2020 23:41   MR THORACIC SPINE W WO CONTRAST  Result Date: 09/17/2020 CLINICAL DATA:  Demyelinating disease. EXAM: MRI CERVICAL AND THORACIC SPINE WITHOUT AND WITH CONTRAST TECHNIQUE: Multiplanar and multiecho pulse sequences of the cervical spine, to include the craniocervical junction and cervicothoracic junction, and the thoracic spine, were obtained without and with intravenous contrast. CONTRAST:  57mL GADAVIST GADOBUTROL 1 MMOL/ML IV SOLN COMPARISON:  None. FINDINGS: MRI CERVICAL SPINE  FINDINGS Alignment: Physiologic.  Reversal of normal lordotic curvature. Vertebrae: No fracture, evidence of discitis, or bone lesion. Cord: Normal signal and morphology. Posterior Fossa, vertebral arteries, paraspinal tissues: Negative. Disc levels: C1-2: Unremarkable. C2-3: Normal disc space and facet joints. There is no spinal canal stenosis. No neural foraminal stenosis. C3-4: Normal disc space and facet joints. There is no spinal canal stenosis. No neural foraminal stenosis. C4-5: Small central disc protrusion. There is no spinal canal stenosis. No neural foraminal stenosis. C5-6: Small left subarticular disc protrusion. There is no spinal canal stenosis. No neural foraminal stenosis. C6-7: Minimal disc bulge. There is no spinal canal stenosis. No neural foraminal stenosis. C7-T1: Normal disc space and facet joints. There is no spinal canal stenosis. No neural foraminal stenosis. MRI THORACIC SPINE FINDINGS Alignment:  Physiologic. Vertebrae: No fracture, evidence of discitis, or bone lesion. Cord:  Normal signal and morphology. Paraspinal and other soft tissues: Negative. Disc levels: No spinal canal or neural foraminal stenosis. IMPRESSION: 1. No evidence of demyelinating disease of the cervical or thoracic spine. 2. Mild cervical degenerative disc disease without spinal canal or neural foraminal stenosis. Electronically Signed   By: Deatra Robinson M.D.   On: 09/17/2020 23:41    Scheduled Meds:  enoxaparin (LOVENOX) injection  1 mg/kg Subcutaneous Q12H   multivitamin with minerals  1 tablet Oral Daily   Ensure Max Protein  11 oz Oral BID   sertraline  50 mg Oral QHS   Continuous Infusions:  sodium chloride 1,000 mL (09/18/20 0245)   heparin 1,050 Units/hr (09/17/20 1800)   methylPREDNISolone (SOLU-MEDROL) injection Stopped (09/17/20 1555)   PRN Meds: acetaminophen, butalbital-acetaminophen-caffeine, morphine injection, ondansetron **OR** ondansetron (ZOFRAN) IV, oxyCODONE, zolpidem  Time spent:  35 minutes  Author: Gillis Santa. MD [facility] 09/18/2020 3:29 PM  To reach On-call, see care teams to locate the attending and reach out to them via [email or web address]. If 7PM-7AM, please contact night-coverage If you still have difficulty reaching the attending provider, please page the [facility] (Director on Call) for [facility] on amion for assistance.

## 2020-09-18 NOTE — Progress Notes (Signed)
Initial Nutrition Assessment  DOCUMENTATION CODES:  Not applicable  INTERVENTION:  Continue current diet as ordered, encourage adequate intake Provided education on the importance of consistent intake at home MVI with minerals daily Ensure Enlive po BID, each supplement provides 350 kcal and 20 grams of protein max  NUTRITION DIAGNOSIS:  Inadequate oral intake related to  (limited food acceptance) as evidenced by per patient/family report.  GOAL:  Patient will meet greater than or equal to 90% of their needs  MONITOR:  PO intake, Supplement acceptance, Labs  REASON FOR ASSESSMENT:  Malnutrition Screening Tool    ASSESSMENT:  30 y.o. female presented to ED for worsening pain on the right side of her head accompanied by headache, vertigo, and blurry vision in the right eye. Pt followed outpatient by neurology and is awaiting MRI of the brain to rule out MS.  Pain got worse the day of admission and she did not feel that she could wait any longer for testing so presented to ED.  Pt resting in bed at the time of visit. Discussed reported weight loss and poor appetite from admission screen. Pt reports that in general, appetite and eating patterns are not great at home. Weight loss was unintentional due to poor intake habits.   Discussed usual intake at home. Pt reports that she typically eats 2 meals a day. Primarily drinks coffee, water, and pedialyte. Wakes up ~9-10am and first meal is at 2pm (oatmeal with milk). Next meal is late at night after significant other gets home from work ~9pm and is usually take out ([employer], [employer], etc) but she does sometimes cook chicken with rice and vegetables. Pt does report that she would like to lose weight but also seems to have some fear and hesitations over which foods are safe for her to consume and healthy (worried about long ingredient lists, additives, preservatives, etc).   Had a lengthy discussion about the importance of consuming  adequate nutrition to promote higher metabolism and eating on a consistent schedule. Discouraged going >4-5 hours between meals. Discussed the role that each macronutrient played in health and satiety. Gave ideas for mini meals and snacks that could be utilized at home. Pt agreeable to receiving a nutrition supplement during admission to see if it is something she would like to try at home.    Average Meal Intake: 7/20-7/22: 50% intake x 1 recorded meals  Nutritionally Relevant Medications: Continuous Infusions:  methylPREDNISolone injection Stopped (09/17/20 1555)   PRN Meds: ondansetron  Labs Reviewed  NUTRITION - FOCUSED PHYSICAL EXAM: Flowsheet Row Most Recent Value  Orbital Region No depletion  Upper Arm Region No depletion  Thoracic and Lumbar Region No depletion  Buccal Region No depletion  Temple Region No depletion  Clavicle Bone Region No depletion  Clavicle and Acromion Bone Region No depletion  Scapular Bone Region No depletion  Dorsal Hand No depletion  Patellar Region No depletion  Anterior Thigh Region No depletion  Posterior Calf Region No depletion  Edema (RD Assessment) None  Hair Reviewed  Eyes Reviewed  Mouth Reviewed  Skin Reviewed  Nails Reviewed   Diet Order:   Diet Order             Diet regular Room service appropriate? Yes; Fluid consistency: Thin  Diet effective now                   EDUCATION NEEDS:  Education needs have been addressed  Skin:  Skin Assessment: Reviewed RN Assessment  Last  BM:  7/21  Height:  Ht Readings from Last 1 Encounters:  09/16/20 5\' 2"  (1.575 m)    Weight:  Wt Readings from Last 1 Encounters:  09/16/20 77.1 kg    Ideal Body Weight:  50 kg  BMI:  Body mass index is 31.09 kg/m.  Estimated Nutritional Needs:  Kcal:  1600-1800 kcal/d Protein:  80-100 g/d Fluid:  >1.8 L/d  09/18/20, RD, LDN Clinical Dietitian Pager on Amion

## 2020-09-19 LAB — CBC
HCT: 35.9 % — ABNORMAL LOW (ref 36.0–46.0)
Hemoglobin: 11.7 g/dL — ABNORMAL LOW (ref 12.0–15.0)
MCH: 27.3 pg (ref 26.0–34.0)
MCHC: 32.6 g/dL (ref 30.0–36.0)
MCV: 83.7 fL (ref 80.0–100.0)
Platelets: 198 10*3/uL (ref 150–400)
RBC: 4.29 MIL/uL (ref 3.87–5.11)
RDW: 13.2 % (ref 11.5–15.5)
WBC: 11.3 10*3/uL — ABNORMAL HIGH (ref 4.0–10.5)
nRBC: 0 % (ref 0.0–0.2)

## 2020-09-19 LAB — BASIC METABOLIC PANEL
Anion gap: 6 (ref 5–15)
BUN: 12 mg/dL (ref 6–20)
CO2: 25 mmol/L (ref 22–32)
Calcium: 8.8 mg/dL — ABNORMAL LOW (ref 8.9–10.3)
Chloride: 108 mmol/L (ref 98–111)
Creatinine, Ser: 0.58 mg/dL (ref 0.44–1.00)
GFR, Estimated: >60 mL/min (ref >60)
Glucose, Bld: 141 mg/dL — ABNORMAL HIGH (ref 70–99)
Potassium: 3.7 mmol/L (ref 3.5–5.1)
Sodium: 139 mmol/L (ref 135–145)

## 2020-09-19 LAB — PHOSPHORUS: Phosphorus: 4 mg/dL (ref 2.5–4.6)

## 2020-09-19 LAB — MAGNESIUM: Magnesium: 2.1 mg/dL (ref 1.7–2.4)

## 2020-09-19 LAB — HEPARIN LEVEL (UNFRACTIONATED): Heparin Unfractionated: 0.38 IU/mL (ref 0.30–0.70)

## 2020-09-19 MED ORDER — ALUM & MAG HYDROXIDE-SIMETH 200-200-20 MG/5ML PO SUSP
30.0000 mL | Freq: Four times a day (QID) | ORAL | Status: DC | PRN
  Administered 2020-09-19: 30 mL via ORAL
  Filled 2020-09-19: qty 30

## 2020-09-19 MED ORDER — PANTOPRAZOLE SODIUM 40 MG PO TBEC
40.0000 mg | DELAYED_RELEASE_TABLET | Freq: Every day | ORAL | Status: DC
  Administered 2020-09-19 – 2020-09-21 (×3): 40 mg via ORAL
  Filled 2020-09-19 (×3): qty 1

## 2020-09-19 MED ORDER — ENOXAPARIN SODIUM 80 MG/0.8ML IJ SOSY
1.0000 mg/kg | PREFILLED_SYRINGE | Freq: Two times a day (BID) | INTRAMUSCULAR | Status: DC
  Administered 2020-09-19 – 2020-09-21 (×5): 77.5 mg via SUBCUTANEOUS
  Filled 2020-09-19 (×5): qty 0.78

## 2020-09-19 NOTE — Progress Notes (Signed)
[facility] Progress Note  Patient: Sophia Hampton    DDU:[id number]  DOA: 09/16/2020     Date of Service: the patient was seen and examined on 09/19/2020  Chief Complaint  Patient presents with   Headache   Brief hospital course: Sophia Hampton is a 30 y.o. female with medical history significant of no significant past medical history except for depression hyperlipidemia and insomnia who has been experiencing headaches especially in the right side of the head vertigo, blurry vision in the right eye.  She was seen recently and apparently sent to a neurologist who evaluated her and suspected multiple sclerosis.  Patient is awaiting insurance authorization for outpatient MRI but the headache is getting worse.  She has been taking over-the-counter medications including NSAIDs with no relief.  Patient therefore came to the ER today for evaluation.  MRI of the brain was performed showing progressive demyelination most likely consistent with multiple sclerosis.  Additionally showed cavernous sinus thrombosis including right internal jugular vein.  Patient is therefore being admitted to the hospital for management.  Neurology being consulted as well..   ED Course: Vitals as well as CBC chemistry all within normal.  MRI of the brain showed contrast-enhancing lesion in the left frontal lobe consistent with active demyelination.  There are multiple chronic white matter lesions in a pattern consistent with multiple sclerosis.  Patient also has thrombosis of the right transverse sinus sigmoid sinus and internal jugular veins.  Patient initiated on IV heparin.  Being admitted to the hospital for evaluation and treatment.   Assessment and Plan: Principal Problem:   Multiple sclerosis (HCC) Active Problems:   Depression   Hyperlipidemia   Insomnia   Cavernous sinus thrombosis       # Cavernous sinus thrombosis: Patient has thrombosis of the right transverse sinus, sigmoid sinus and internal jugular vein.    Discontinue OCP, continue current medication for headache S/p IV heparin gtt, transition to Lovenox therapeutic dose twice daily on 7/23.   Hematology consulted, hypercoagulable work-up is pending  Transition to Pradaxa on discharge.    # Multiple sclerosis: New diagnosis.   Patient was seen by telemetry neurologist, recommended IV Solu-Medrol 1000 mg daily for 5 days, follow-up with neurology as an outpatient for immunomodulating regime MRI brain: Contrast-enhancing lesion in the left frontal lobe consistent with active demyelination. Multiple chronic white matter lesions in a pattern consistent multiple sclerosis MRI C and T-spine negative, no acute findings     # Depression: We will confirm home regimen and continue   # Hyperlipidemia: Not on a statin.  Continue outpatient management    Body mass index is 31.09 kg/m.  Interventions:      Diet: Regular DVT Prophylaxis: Therapeutic Anticoagulation with IV heparin infusion    Advance goals of care discussion: Full code  Family Communication: family was present at bedside, at the time of interview.  The pt provided permission to discuss medical plan with the family. Opportunity was given to ask question and all questions were answered satisfactorily.   Disposition:  Pt is from Home, admitted with Head ache, new onset MS and cavernous sinus thrombus, she is on IV heparin infusion and IV Solu-Medrol, which precludes a safe discharge. Discharge to home on Monday after 5 days treatment of IV Solu-Medrol    Subjective: No overnight active issues, patient's pain is better, still has pain on the right side temporal area which is off and on, slept well overnight.  No any other neurological findings.  Physical Exam: General:  alert oriented to time, place, and person.  Appear in mild distress, affect appropriate Eyes: PERRLA ENT: Oral Mucosa Clear, moist  Neck: no JVD,  Cardiovascular: S1 and S2 Present, no Murmur,   Respiratory: good respiratory effort, Bilateral Air entry equal and Decreased, no Crackles, no wheezes Abdomen: Bowel Sound present, Soft and no tenderness,  Skin: no rashes Extremities: no Pedal edema, no calf tenderness Neurologic: without any new focal findings Gait not checked due to patient safety concerns  Vitals:   09/18/20 1548 09/18/20 2009 09/19/20 0509 09/19/20 0732  BP: 110/63 113/68 (!) 112/59 115/68  Pulse: 76 68 (!) 55 (!) 57  Resp: 16 17 17 17   Temp: 98.3 F (36.8 C) 98.4 F (36.9 C) 98 F (36.7 C) 98.5 F (36.9 C)  TempSrc:      SpO2: 100% 100% 100% 100%  Weight:      Height:        Intake/Output Summary (Last 24 hours) at 09/19/2020 1254 Last data filed at 09/19/2020 0751 Gross per 24 hour  Intake 3353.92 ml  Output 2 ml  Net 3351.92 ml   Filed Weights   09/16/20 1926  Weight: 77.1 kg    Data Reviewed: I have personally reviewed and interpreted daily labs, tele strips, imagings as discussed above. I reviewed all nursing notes, pharmacy notes, vitals, pertinent old records I have discussed plan of care as described above with RN and patient/family.  CBC: Recent Labs  Lab 09/16/20 2115 09/17/20 0433 09/18/20 0522 09/19/20 0533  WBC 9.8 11.3* 7.4 11.3*  NEUTROABS 6.6  --   --   --   HGB 14.0 13.1 12.6 11.7*  HCT 42.2 40.9 38.7 35.9*  MCV 83.1 84.0 81.6 83.7  PLT 213 221 195 198   Basic Metabolic Panel: Recent Labs  Lab 09/16/20 2115 09/17/20 0433 09/18/20 0522 09/19/20 0533  NA 137 137 137 139  K 3.7 3.8 4.4 3.7  CL 104 104 107 108  CO2 24 22 25 25   GLUCOSE 92 119* 150* 141*  BUN 10 8 8 12   CREATININE 0.50 0.57 0.60 0.58  CALCIUM 9.4 9.2 9.1 8.8*  MG  --   --  1.8 2.1  PHOS  --   --  3.8 4.0    Studies: No results found.  Scheduled Meds:  enoxaparin (LOVENOX) injection  1 mg/kg Subcutaneous Q12H   multivitamin with minerals  1 tablet Oral Daily   pantoprazole  40 mg Oral Daily   Ensure Max Protein  11 oz Oral BID    sertraline  50 mg Oral QHS   Continuous Infusions:  methylPREDNISolone (SOLU-MEDROL) injection Stopped (09/17/20 1555)   PRN Meds: acetaminophen, butalbital-acetaminophen-caffeine, morphine injection, ondansetron **OR** ondansetron (ZOFRAN) IV, oxyCODONE, zolpidem  Time spent: 35 minutes  Author: . MD [facility] 09/19/2020 12:54 PM  To reach On-call, see care teams to locate the attending and reach out to them via [email or web address]. If 7PM-7AM, please contact night-coverage If you still have difficulty reaching the attending provider, please page the [facility] (Director on Call) for [facility] on amion for assistance.

## 2020-09-19 NOTE — Progress Notes (Addendum)
ANTICOAGULATION CONSULT NOTE  Pharmacy Consult for Heparin  Indication:  sinus thromboembolism  No Known Allergies  Patient Measurements: Height: 5\' 2"  (157.5 cm) Weight: 77.1 kg (170 lb) IBW/kg (Calculated) : 50.1 Heparin Dosing Weight:  67 kg   Vital Signs: Temp: 98 F (36.7 C) (07/23 0509) BP: 112/59 (07/23 0509) Pulse Rate: 55 (07/23 0509)  Labs: Recent Labs    09/16/20 2355 09/17/20 0433 09/17/20 0800 09/17/20 2214 09/18/20 0522 09/19/20 0533  HGB  --  13.1  --   --  12.6 11.7*  HCT  --  40.9  --   --  38.7 35.9*  PLT  --  221  --   --  195 198  APTT 24  --   --   --   --   --   LABPROT 13.7  --   --   --   --   --   INR 1.1  --   --   --   --   --   HEPARINUNFRC  --   --    < > 0.32 0.36 0.38  CREATININE  --  0.57  --   --  0.60 0.58   < > = values in this interval not displayed.     Estimated Creatinine Clearance: 99.8 mL/min (by C-G formula based on SCr of 0.58 mg/dL).   Medical History: No past medical history on file.    Assessment: Pharmacy consulted to dose heparin in this 30 year old female admitted with Sinus thromboembolism.  No prior anticoag noted.   7/21 1606 HL = 0.44 therapeutic x 1 @ 1050 units/hr 7/22 0522 HL = 0.36  7/23 0533 HL = 0.38   Goal of Therapy:  HL:  0.3 - 0.7 Monitor platelets by anticoagulation protocol: Yes   Plan:  Heparin level is therapeutic. Will continue heparin infusion at 1050 units/hr. Recheck heparin level and CBC with AM labs. Plan to continue IV heparin for 5 days prior to switching to dabigatran.    8/23, PharmD 09/19/2020,7:19 AM

## 2020-09-19 NOTE — Plan of Care (Signed)
  Problem: Education: Goal: Knowledge of General Education information will improve Description Including pain rating scale, medication(s)/side effects and non-pharmacologic comfort measures Outcome: Progressing   

## 2020-09-19 NOTE — Progress Notes (Signed)
Subjective: Headache is significantly improved  Exam: Vitals:   09/19/20 0509 09/19/20 0732  BP: (!) 112/59 115/68  Pulse: (!) 55 (!) 57  Resp: 17 17  Temp: 98 F (36.7 C) 98.5 F (36.9 C)  SpO2: 100% 100%   Gen: In bed, NAD Resp: non-labored breathing, no acute distress Abd: soft, nt  Neuro: MS: Awake, alert, interactive and appropriate CN: Pupils equal round and reactive to light, visual fields full, though she does report some spots when looking out of her right eye there is a very mild afferent pupillary defect of the right eye Motor: No drift Sensory: Intact light touch DTR: 2+ and symmetric at the biceps, knees, ankles toes downgoing  Pertinent Labs: Initial INR and PTT were normal. Factor V Leiden, prothrombin, antiphospholipid, protein C&S are pending ANA, anti-ENA(including SSA and SSB), antiphospholipid panel pending  Impression: 30 year old female with a history of neurological disturbances since last December who presents with headache since Sunday.  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  Recommendations: 1) can change from heparin to Pradaxa for CVT on discharge 2) Solu-Medrol 1 g daily for total of 5 days, today is day 3 3) neurology will follow  Sophia Slot, MD [facility] [phone]  If 7pm- 7am, please page neurology on call as listed in AMION.

## 2020-09-20 LAB — BASIC METABOLIC PANEL
Anion gap: 8 (ref 5–15)
BUN: 17 mg/dL (ref 6–20)
CO2: 24 mmol/L (ref 22–32)
Calcium: 8.7 mg/dL — ABNORMAL LOW (ref 8.9–10.3)
Chloride: 105 mmol/L (ref 98–111)
Creatinine, Ser: 0.6 mg/dL (ref 0.44–1.00)
GFR, Estimated: >60 mL/min (ref >60)
Glucose, Bld: 126 mg/dL — ABNORMAL HIGH (ref 70–99)
Potassium: 4 mmol/L (ref 3.5–5.1)
Sodium: 137 mmol/L (ref 135–145)

## 2020-09-20 LAB — CBC
HCT: 36.5 % (ref 36.0–46.0)
Hemoglobin: 11.9 g/dL — ABNORMAL LOW (ref 12.0–15.0)
MCH: 26.9 pg (ref 26.0–34.0)
MCHC: 32.6 g/dL (ref 30.0–36.0)
MCV: 82.4 fL (ref 80.0–100.0)
Platelets: 187 10*3/uL (ref 150–400)
RBC: 4.43 MIL/uL (ref 3.87–5.11)
RDW: 13.1 % (ref 11.5–15.5)
WBC: 8.9 10*3/uL (ref 4.0–10.5)
nRBC: 0 % (ref 0.0–0.2)

## 2020-09-20 LAB — HEPARIN LEVEL (UNFRACTIONATED): Heparin Unfractionated: 0.43 IU/mL (ref 0.30–0.70)

## 2020-09-20 LAB — PHOSPHORUS: Phosphorus: 3.8 mg/dL (ref 2.5–4.6)

## 2020-09-20 LAB — GLUCOSE, CAPILLARY: Glucose-Capillary: 122 mg/dL — ABNORMAL HIGH (ref 70–99)

## 2020-09-20 LAB — MAGNESIUM: Magnesium: 2.4 mg/dL (ref 1.7–2.4)

## 2020-09-20 MED ORDER — BISACODYL 5 MG PO TBEC
10.0000 mg | DELAYED_RELEASE_TABLET | Freq: Once | ORAL | Status: AC
  Administered 2020-09-20: 10 mg via ORAL
  Filled 2020-09-20: qty 2

## 2020-09-20 MED ORDER — SODIUM CHLORIDE 0.9 % IV SOLN
1000.0000 mg | Freq: Every day | INTRAVENOUS | Status: AC
  Administered 2020-09-20 – 2020-09-21 (×2): 1000 mg via INTRAVENOUS
  Filled 2020-09-20 (×2): qty 1000

## 2020-09-20 NOTE — Progress Notes (Signed)
ANTICOAGULATION CONSULT NOTE  Pharmacy Consult for Heparin  Indication:  sinus thromboembolism  No Known Allergies  Patient Measurements: Height: 5\' 2"  (157.5 cm) Weight: 77.1 kg (170 lb) IBW/kg (Calculated) : 50.1 Heparin Dosing Weight:  67 kg   Vital Signs: Temp: 98 F (36.7 C) (07/24 0606) Temp Source: Oral (07/24 0606) BP: 124/74 (07/24 0606) Pulse Rate: 49 (07/24 0606)  Labs: Recent Labs    09/18/20 0522 09/19/20 0533 09/20/20 0517  HGB 12.6 11.7*  --   HCT 38.7 35.9*  --   PLT 195 198  --   HEPARINUNFRC 0.36 0.38 0.43  CREATININE 0.60 0.58 0.60     Estimated Creatinine Clearance: 99.8 mL/min (by C-G formula based on SCr of 0.6 mg/dL).   Medical History: No past medical history on file.    Assessment: Pharmacy consulted to dose heparin in this 30 year old female admitted with Sinus thromboembolism.  No prior anticoag noted.   7/21 1606 HL = 0.44 therapeutic x 1 @ 1050 units/hr 7/22 0522 HL = 0.36  7/23 0533 HL = 0.38  7/24 0517 HL = 0.43, therapeutic X 4   Goal of Therapy:  HL:  0.3 - 0.7 Monitor platelets by anticoagulation protocol: Yes   Plan:  7/24:  HL @ 0517 = 0.43  Will continue pt on current rate and recheck HL on 7/25 with AM labs.  Plan to continue IV heparin for 5 days prior to switching to dabigatran.    Dorise Gangi D, PharmD 09/20/2020,6:50 AM

## 2020-09-20 NOTE — Progress Notes (Signed)
Subjective: Headache is significantly improved  Exam: Vitals:   09/20/20 0606 09/20/20 0809  BP: 124/74 122/75  Pulse: (!) 49 (!) 49  Resp: 14 17  Temp: 98 F (36.7 C) 97.7 F (36.5 C)  SpO2: 99% 100%   Gen: In bed, NAD Resp: non-labored breathing, no acute distress Abd: soft, nt  Neuro: MS: Awake, alert, interactive and appropriate CN: Pupils equal round and reactive to light, visual fields full, though she does report some spots when looking out of her right eye there is a very mild afferent pupillary defect of the right eye Motor: No drift Sensory: Intact light touch DTR: 2+ and symmetric at the biceps, knees, ankles toes downgoing  Pertinent Labs: Initial INR and PTT were normal. Factor V Leiden, prothrombin, antiphospholipid, protein C&S are pending ANA, anti-ENA(including SSA and SSB), antiphospholipid panel pending  Impression: 30 year old female with a history of neurological disturbances since last December who presents with headache since Sunday.  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  Recommendations: 1) can change from Lovenox to Pradaxa 150 mg twice daily for CVT on discharge 2) Solu-Medrol 1 g daily for total of 5 days, today is day 4, can give tomorrow's dose little earlier so she can be discharged. 3) she will need follow-up with Dr. Malvin Johns with neurology.  Ritta Slot, MD [facility] [phone]  If 7pm- 7am, please page neurology on call as listed in AMION.

## 2020-09-20 NOTE — Progress Notes (Signed)
[facility] Progress Note  Patient: Sophia Hampton    VQQ:[id number]  DOA: 09/16/2020     Date of Service: the patient was seen and examined on 09/20/2020  Chief Complaint  Patient presents with   Headache   Brief hospital course: Sophia Hampton is a 30 y.o. female with medical history significant of no significant past medical history except for depression hyperlipidemia and insomnia who has been experiencing headaches especially in the right side of the head vertigo, blurry vision in the right eye.  She was seen recently and apparently sent to a neurologist who evaluated her and suspected multiple sclerosis.  Patient is awaiting insurance authorization for outpatient MRI but the headache is getting worse.  She has been taking over-the-counter medications including NSAIDs with no relief.  Patient therefore came to the ER today for evaluation.  MRI of the brain was performed showing progressive demyelination most likely consistent with multiple sclerosis.  Additionally showed cavernous sinus thrombosis including right internal jugular vein.  Patient is therefore being admitted to the hospital for management.  Neurology being consulted as well..   ED Course: Vitals as well as CBC chemistry all within normal.  MRI of the brain showed contrast-enhancing lesion in the left frontal lobe consistent with active demyelination.  There are multiple chronic white matter lesions in a pattern consistent with multiple sclerosis.  Patient also has thrombosis of the right transverse sinus sigmoid sinus and internal jugular veins.  Patient initiated on IV heparin.  Being admitted to the hospital for evaluation and treatment.   Assessment and Plan: Principal Problem:   Multiple sclerosis (HCC) Active Problems:   Depression   Hyperlipidemia   Insomnia   Cavernous sinus thrombosis       # Cavernous sinus thrombosis: Patient has thrombosis of the right transverse sinus, sigmoid sinus and internal jugular vein.    Discontinue OCP, continue current medication for headache S/p IV heparin gtt, transition to Lovenox therapeutic dose twice daily on 7/23.   Hematology consulted, hypercoagulable work-up is pending  Transition to Pradaxa on discharge.    # Multiple sclerosis: New diagnosis.   Patient was seen by telemetry neurologist, recommended IV Solu-Medrol 1000 mg daily for 5 days, last dose will be on Monday 7/25, no more steroids and patient can follow-up as an outpatient with neurology.   Follow-up with neurology as an outpatient for immunomodulating regime if needs MRI brain: Contrast-enhancing lesion in the left frontal lobe consistent with active demyelination. Multiple chronic white matter lesions in a pattern consistent multiple sclerosis MRI C and T-spine negative, no acute findings     # GERD and indigestion could be secondary to steroids and constipation Started pantoprazole 40 mg daily, Dulcolax 10 mg p.o. x1 dose given  # Depression: We will confirm home regimen and continue   # Hyperlipidemia: Not on a statin.  LDL 127, total cholesterol 193, continue diet control and f/u PCP  # Constipation, patient did not have BM since admission, Dulcolax 10 mg po x 1 dose given, normally patient does not have constipation and poops 2-3 times per day so no need to keep her on daily laxatives  # Obesity, body mass index is 31.09 kg/m.  Interventions: Calorie restricted diet and exercise advised to lose body weight.      Diet: Regular DVT Prophylaxis: Therapeutic Anticoagulation with IV heparin infusion    Advance goals of care discussion: Full code  Family Communication: family was present at bedside, at the time of interview.  The pt  provided permission to discuss medical plan with the family. Opportunity was given to ask question and all questions were answered satisfactorily.   Disposition:  Pt is from Home, admitted with Head ache, new onset MS and cavernous sinus thrombus, she is on IV  heparin infusion and IV Solu-Medrol, which precludes a safe discharge. Discharge to home on Monday after 5 days treatment of IV Solu-Medrol    Subjective: No overnight active issues, headache is almost resolved, patient had 80 pain last night, vision is better now.  Denied any other active issues.  Patient was little bit sleepy in the morning. Patient stated that she is feeling indigestion and had no BM since admission, no abdominal pain. Agreed for laxatives    Physical Exam: General:  alert oriented to time, place, and person.  Appear in mild distress, affect appropriate Eyes: PERRLA ENT: Oral Mucosa Clear, moist  Neck: no JVD,  Cardiovascular: S1 and S2 Present, no Murmur,  Respiratory: good respiratory effort, Bilateral Air entry equal and Decreased, no Crackles, no wheezes Abdomen: Bowel Sound present, Soft and no tenderness,  Skin: no rashes Extremities: no Pedal edema, no calf tenderness Neurologic: without any new focal findings Gait not checked due to patient safety concerns  Vitals:   09/19/20 1619 09/19/20 2133 09/20/20 0606 09/20/20 0809  BP: (!) 103/53 127/74 124/74 122/75  Pulse: 61 (!) 59 (!) 49 (!) 49  Resp: 15  14 17   Temp: 98.5 F (36.9 C) 98.5 F (36.9 C) 98 F (36.7 C) 97.7 F (36.5 C)  TempSrc:   Oral Oral  SpO2: 99% 100% 99% 100%  Weight:      Height:        Intake/Output Summary (Last 24 hours) at 09/20/2020 1343 Last data filed at 09/20/2020 09/22/2020 Gross per 24 hour  Intake 57.39 ml  Output --  Net 57.39 ml   Filed Weights   09/16/20 1926  Weight: 77.1 kg    Data Reviewed: I have personally reviewed and interpreted daily labs, tele strips, imagings as discussed above. I reviewed all nursing notes, pharmacy notes, vitals, pertinent old records I have discussed plan of care as described above with RN and patient/family.  CBC: Recent Labs  Lab 09/16/20 2115 09/17/20 0433 09/18/20 0522 09/19/20 0533 09/20/20 0746  WBC 9.8 11.3* 7.4  11.3* 8.9  NEUTROABS 6.6  --   --   --   --   HGB 14.0 13.1 12.6 11.7* 11.9*  HCT 42.2 40.9 38.7 35.9* 36.5  MCV 83.1 84.0 81.6 83.7 82.4  PLT 213 221 195 198 187   Basic Metabolic Panel: Recent Labs  Lab 09/16/20 2115 09/17/20 0433 09/18/20 0522 09/19/20 0533 09/20/20 0517  NA 137 137 137 139 137  K 3.7 3.8 4.4 3.7 4.0  CL 104 104 107 108 105  CO2 24 22 25 25 24   GLUCOSE 92 119* 150* 141* 126*  BUN 10 8 8 12 17   CREATININE 0.50 0.57 0.60 0.58 0.60  CALCIUM 9.4 9.2 9.1 8.8* 8.7*  MG  --   --  1.8 2.1 2.4  PHOS  --   --  3.8 4.0 3.8    Studies: No results found.  Scheduled Meds:  enoxaparin (LOVENOX) injection  1 mg/kg Subcutaneous Q12H   multivitamin with minerals  1 tablet Oral Daily   pantoprazole  40 mg Oral Daily   Ensure Max Protein  11 oz Oral BID   sertraline  50 mg Oral QHS   Continuous Infusions:  methylPREDNISolone (SOLU-MEDROL)  injection 1,000 mg (09/20/20 1324)   PRN Meds: acetaminophen, alum & mag hydroxide-simeth, butalbital-acetaminophen-caffeine, morphine injection, ondansetron **OR** ondansetron (ZOFRAN) IV, oxyCODONE, zolpidem  Time spent: 35 minutes  Author: Gillis Santa. MD [facility] 09/20/2020 1:43 PM  To reach On-call, see care teams to locate the attending and reach out to them via [email or web address]. If 7PM-7AM, please contact night-coverage If you still have difficulty reaching the attending provider, please page the [facility] (Director on Call) for [facility] on amion for assistance.

## 2020-09-21 DIAGNOSIS — G08 Intracranial and intraspinal phlebitis and thrombophlebitis: Secondary | ICD-10-CM

## 2020-09-21 LAB — CBC
HCT: 38 % (ref 36.0–46.0)
Hemoglobin: 12.7 g/dL (ref 12.0–15.0)
MCH: 27.6 pg (ref 26.0–34.0)
MCHC: 33.4 g/dL (ref 30.0–36.0)
MCV: 82.6 fL (ref 80.0–100.0)
Platelets: 231 10*3/uL (ref 150–400)
RBC: 4.6 MIL/uL (ref 3.87–5.11)
RDW: 13.2 % (ref 11.5–15.5)
WBC: 7.6 10*3/uL (ref 4.0–10.5)
nRBC: 0 % (ref 0.0–0.2)

## 2020-09-21 LAB — BASIC METABOLIC PANEL
Anion gap: 5 (ref 5–15)
BUN: 18 mg/dL (ref 6–20)
CO2: 28 mmol/L (ref 22–32)
Calcium: 8.8 mg/dL — ABNORMAL LOW (ref 8.9–10.3)
Chloride: 108 mmol/L (ref 98–111)
Creatinine, Ser: 0.7 mg/dL (ref 0.44–1.00)
GFR, Estimated: >60 mL/min (ref >60)
Glucose, Bld: 129 mg/dL — ABNORMAL HIGH (ref 70–99)
Potassium: 3.9 mmol/L (ref 3.5–5.1)
Sodium: 141 mmol/L (ref 135–145)

## 2020-09-21 LAB — PROTEIN S ACTIVITY: Protein S Activity: 74 % (ref 63–140)

## 2020-09-21 LAB — ANTIEXTRACTABLE NUCLEAR AG
ENA SM Ab Ser-aCnc: <0.2 AI (ref 0.0–0.9)
Ribonucleic Protein: <0.2 AI (ref 0.0–0.9)

## 2020-09-21 LAB — PROTEIN C ACTIVITY: Protein C Activity: 136 % (ref 73–180)

## 2020-09-21 LAB — ANA: Anti Nuclear Antibody (ANA): POSITIVE — AB

## 2020-09-21 MED ORDER — PANTOPRAZOLE SODIUM 40 MG PO TBEC: 40.0000 mg | DELAYED_RELEASE_TABLET | Freq: Every day | ORAL | 0 refills | Status: DC

## 2020-09-21 MED ORDER — APIXABAN (ELIQUIS) VTE STARTER PACK (10MG AND 5MG): ORAL_TABLET | ORAL | 0 refills | Status: DC

## 2020-09-21 MED ORDER — ALUM & MAG HYDROXIDE-SIMETH 200-200-20 MG/5ML PO SUSP: 30.0000 mL | ORAL | Status: DC | PRN

## 2020-09-21 NOTE — Discharge Summary (Signed)
Physician Discharge Summary  Sophia Hampton IHW:[id number] DOB: [birth year] DOA: 09/16/2020  PCP: System, Provider Not In  Admit date: 09/16/2020 Discharge date: 09/21/2020  Admitted From: Home Disposition: Home  Recommendations for Outpatient Follow-up:  Follow up with PCP in 1-2 weeks Follow-up with neurology 1 week Follow-up with hematology 1 week  Home Health: No Equipment/Devices: None  Discharge Condition: Stable CODE STATUS: Full Diet recommendation: Regular  Brief/Interim Summary: Sophia Hampton is a 30 y.o. female with medical history significant of no significant past medical history except for depression hyperlipidemia and insomnia who has been experiencing headaches especially in the right side of the head vertigo, blurry vision in the right eye.  She was seen recently and apparently sent to a neurologist who evaluated her and suspected multiple sclerosis.  Patient is awaiting insurance authorization for outpatient MRI but the headache is getting worse.  She has been taking over-the-counter medications including NSAIDs with no relief.  Patient therefore came to the ER today for evaluation.  MRI of the brain was performed showing progressive demyelination most likely consistent with multiple sclerosis.  Additionally showed cavernous sinus thrombosis including right internal jugular vein.  Patient is therefore being admitted to the hospital for management.  Neurology being consulted as well..  Clinical presentation and MRI imaging survey consistent with new diagnosis multiple sclerosis.  Additionally showed Venous sinus thrombosis.  Patient underwent 5 days of steroid burst therapy 1 g Solu-Medrol daily.  Also started on subcutaneous Lovenox for sinus venous thrombosis.  This was converted to Eliquis at time of discharge.  Agent of choice per neurology would be Pradaxa considering the amount of data available however Pradaxa was not covered by patient's insurance.  Neurology okay with  switch to Eliquis.  Eliquis starter pack prescribed on discharge.  Benefits check was completed.  Total out-of-pocket cost $40 per month.  At time of discharge patient will need close follow-up with neurology Dr. Malvin Johns as well as hematology Dr. Donneta Romberg.  Questions answered.  Patient discharged in stable condition.   Discharge Diagnoses:  Principal Problem:   Multiple sclerosis (HCC) Active Problems:   Depression   Hyperlipidemia   Insomnia   Cavernous sinus thrombosis  Cavernous sinus thrombosis Patient has thrombosis of the right transverse sinus, sigmoid sinus and internal jugular vein.   Patient advised to discontinue OCP at time of discharge.  Anticoagulation converted to Eliquis.  Starter pack prescribed.  Patient will discharge with outpatient follow-up with hematology     # Multiple sclerosis: New diagnosis.   MRI imaging survey consistent with a new diagnosis of multiple sclerosis.  Patient underwent 5 days of Solu-Medrol 1 g daily.  Symptoms improved at time of discharge.  Discharged home with close follow-up with neurology     # GERD and indigestion could be secondary to steroids and constipation PPI x14 days prescribed on discharge    Discharge Instructions  Discharge Instructions     Diet - low sodium heart healthy   Complete by: As directed    Increase activity slowly   Complete by: As directed       Allergies as of 09/21/2020   No Known Allergies      Medication List     STOP taking these medications    Sronyx 0.1-20 MG-MCG tablet Generic drug: levonorgestrel-ethinyl estradiol       TAKE these medications    Apixaban Starter Pack (10mg  and 5mg ) Commonly known as: ELIQUIS STARTER PACK Take as directed on package: start with two-5mg  tablets twice daily  for 7 days. On day 8, switch to one-5mg  tablet twice daily.   pantoprazole 40 MG tablet Commonly known as: PROTONIX Take 1 tablet (40 mg total) by mouth daily for 14 days. Start taking on: September 22, 2020   QC TUMERIC COMPLEX PO Take 15 mLs by mouth at bedtime.   sertraline 50 MG tablet Commonly known as: ZOLOFT Take 50 mg by mouth daily.        Follow-up Information     Morene Crocker, MD. Schedule an appointment as soon as possible for a visit in 1 week(s).   Specialty: Neurology Contact information: [address] [facility] [place] [address] [phone]         Earna Coder, MD. Schedule an appointment as soon as possible for a visit in 1 week(s).   Specialties: Internal Medicine, Oncology Contact information: [address] [place] [address] [phone]                No Known Allergies  Consultations: Neurology   Procedures/Studies: MR Brain W and Wo Contrast  Result Date: 09/16/2020 CLINICAL DATA:  Right-sided head pain. History of multiple sclerosis Multiple sclerosis, new event EXAM: MRI HEAD WITHOUT AND WITH CONTRAST TECHNIQUE: Multiplanar, multiecho pulse sequences of the brain and surrounding structures were obtained without and with intravenous contrast. CONTRAST:  51mL GADAVIST GADOBUTROL 1 MMOL/ML IV SOLN COMPARISON:  None. FINDINGS: Brain: No acute infarct, mass effect or extra-axial collection. There are 5-10 hyperintense T2-weighted signal lesions of the periventricular white matter. There is a contrast-enhancing lesion in the left frontal lobe (19:107). The midline structures are normal. Vascular: There is a large filling defect within the right transverse sinus that extends into the sigmoid sinus and internal jugular vein. Skull and upper cervical spine: Normal calvarium and skull base. Visualized upper cervical spine and soft tissues are normal. Sinuses/Orbits:No paranasal sinus fluid levels or advanced mucosal thickening. No mastoid or middle ear effusion. Normal orbits. IMPRESSION: 1. Contrast-enhancing lesion in the left frontal lobe consistent with active demyelination. 2. Thrombosis  of the right transverse sinus, sigmoid sinus and internal jugular vein. 3. Multiple chronic white matter lesions in a pattern consistent multiple sclerosis Electronically Signed   By: Deatra Robinson M.D.   On: 09/16/2020 22:50   MR Cervical Spine W or Wo Contrast  Result Date: 09/17/2020 CLINICAL DATA:  Demyelinating disease. EXAM: MRI CERVICAL AND THORACIC SPINE WITHOUT AND WITH CONTRAST TECHNIQUE: Multiplanar and multiecho pulse sequences of the cervical spine, to include the craniocervical junction and cervicothoracic junction, and the thoracic spine, were obtained without and with intravenous contrast. CONTRAST:  70mL GADAVIST GADOBUTROL 1 MMOL/ML IV SOLN COMPARISON:  None. FINDINGS: MRI CERVICAL SPINE FINDINGS Alignment: Physiologic.  Reversal of normal lordotic curvature. Vertebrae: No fracture, evidence of discitis, or bone lesion. Cord: Normal signal and morphology. Posterior Fossa, vertebral arteries, paraspinal tissues: Negative. Disc levels: C1-2: Unremarkable. C2-3: Normal disc space and facet joints. There is no spinal canal stenosis. No neural foraminal stenosis. C3-4: Normal disc space and facet joints. There is no spinal canal stenosis. No neural foraminal stenosis. C4-5: Small central disc protrusion. There is no spinal canal stenosis. No neural foraminal stenosis. C5-6: Small left subarticular disc protrusion. There is no spinal canal stenosis. No neural foraminal stenosis. C6-7: Minimal disc bulge. There is no spinal canal stenosis. No neural foraminal stenosis. C7-T1: Normal disc space and facet joints. There is no spinal canal stenosis. No neural foraminal stenosis. MRI THORACIC SPINE FINDINGS Alignment:  Physiologic. Vertebrae: No fracture, evidence of discitis, or bone lesion. Cord:  Normal signal and morphology. Paraspinal and other soft tissues: Negative. Disc levels: No spinal canal or neural foraminal stenosis. IMPRESSION: 1. No evidence of demyelinating disease of the cervical or  thoracic spine. 2. Mild cervical degenerative disc disease without spinal canal or neural foraminal stenosis. Electronically Signed   By: Deatra Robinson M.D.   On: 09/17/2020 23:41   MR THORACIC SPINE W WO CONTRAST  Result Date: 09/17/2020 CLINICAL DATA:  Demyelinating disease. EXAM: MRI CERVICAL AND THORACIC SPINE WITHOUT AND WITH CONTRAST TECHNIQUE: Multiplanar and multiecho pulse sequences of the cervical spine, to include the craniocervical junction and cervicothoracic junction, and the thoracic spine, were obtained without and with intravenous contrast. CONTRAST:  7mL GADAVIST GADOBUTROL 1 MMOL/ML IV SOLN COMPARISON:  None. FINDINGS: MRI CERVICAL SPINE FINDINGS Alignment: Physiologic.  Reversal of normal lordotic curvature. Vertebrae: No fracture, evidence of discitis, or bone lesion. Cord: Normal signal and morphology. Posterior Fossa, vertebral arteries, paraspinal tissues: Negative. Disc levels: C1-2: Unremarkable. C2-3: Normal disc space and facet joints. There is no spinal canal stenosis. No neural foraminal stenosis. C3-4: Normal disc space and facet joints. There is no spinal canal stenosis. No neural foraminal stenosis. C4-5: Small central disc protrusion. There is no spinal canal stenosis. No neural foraminal stenosis. C5-6: Small left subarticular disc protrusion. There is no spinal canal stenosis. No neural foraminal stenosis. C6-7: Minimal disc bulge. There is no spinal canal stenosis. No neural foraminal stenosis. C7-T1: Normal disc space and facet joints. There is no spinal canal stenosis. No neural foraminal stenosis. MRI THORACIC SPINE FINDINGS Alignment:  Physiologic. Vertebrae: No fracture, evidence of discitis, or bone lesion. Cord:  Normal signal and morphology. Paraspinal and other soft tissues: Negative. Disc levels: No spinal canal or neural foraminal stenosis. IMPRESSION: 1. No evidence of demyelinating disease of the cervical or thoracic spine. 2. Mild cervical degenerative disc  disease without spinal canal or neural foraminal stenosis. Electronically Signed   By: Deatra Robinson M.D.   On: 09/17/2020 23:41   (Echo, Carotid, EGD, Colonoscopy, ERCP)    Subjective: Patient seen and examined on day of discharge.  Stable no distress.  All questions answered.  Stable for discharge.  Discharge Exam: Vitals:   09/20/20 2054 09/21/20 0400  BP: 134/78 135/80  Pulse: (!) 50 (!) 48  Resp: 17 16  Temp: 98.3 F (36.8 C) 98.1 F (36.7 C)  SpO2: 100% 100%   Vitals:   09/20/20 0809 09/20/20 1542 09/20/20 2054 09/21/20 0400  BP: 122/75 109/73 134/78 135/80  Pulse: (!) 49 (!) 54 (!) 50 (!) 48  Resp: 17 17 17 16   Temp: 97.7 F (36.5 C) 98.1 F (36.7 C) 98.3 F (36.8 C) 98.1 F (36.7 C)  TempSrc: Oral Oral  Oral  SpO2: 100% 99% 100% 100%  Weight:      Height:        General: Pt is alert, awake, not in acute distress Cardiovascular: RRR, S1/S2 +, no rubs, no gallops Respiratory: CTA bilaterally, no wheezing, no rhonchi Abdominal: Soft, NT, ND, bowel sounds + Extremities: no edema, no cyanosis    The results of significant diagnostics from this hospitalization (including imaging, microbiology, ancillary and laboratory) are listed below for reference.     Microbiology: Recent Results (from the past 240 hour(s))  Resp Panel by RT-PCR (Flu A&B, Covid) Nasopharyngeal Swab     Status: None   Collection Time: 09/16/20 11:55 PM   Specimen: Nasopharyngeal Swab; Nasopharyngeal(NP)  swabs in vial transport medium  Result Value Ref Range Status   SARS Coronavirus 2 by RT PCR NEGATIVE NEGATIVE Final    Comment: (NOTE) SARS-CoV-2 target nucleic acids are NOT DETECTED.  The SARS-CoV-2 RNA is generally detectable in upper respiratory specimens during the acute phase of infection. The lowest concentration of SARS-CoV-2 viral copies this assay can detect is 138 copies/mL. A negative result does not preclude SARS-Cov-2 infection and should not be used as the sole basis for  treatment or other patient management decisions. A negative result may occur with  improper specimen collection/handling, submission of specimen other than nasopharyngeal swab, presence of viral mutation(s) within the areas targeted by this assay, and inadequate number of viral copies(<138 copies/mL). A negative result must be combined with clinical observations, patient history, and epidemiological information. The expected result is Negative.  Fact Sheet for Patients:  BloggerCourse.com  Fact Sheet for Healthcare Providers:  SeriousBroker.it  This test is no t yet approved or cleared by the [place] FDA and  has been authorized for detection and/or diagnosis of SARS-CoV-2 by FDA under an Emergency Use Authorization (EUA). This EUA will remain  in effect (meaning this test can be used) for the duration of the COVID-19 declaration under Section 564(b)(1) of the Act, 21 U.S.C.section 360bbb-3(b)(1), unless the authorization is terminated  or revoked sooner.       Influenza A by PCR NEGATIVE NEGATIVE Final   Influenza B by PCR NEGATIVE NEGATIVE Final    Comment: (NOTE) The Xpert Xpress SARS-CoV-2/FLU/RSV plus assay is intended as an aid in the diagnosis of influenza from Nasopharyngeal swab specimens and should not be used as a sole basis for treatment. Nasal washings and aspirates are unacceptable for Xpert Xpress SARS-CoV-2/FLU/RSV testing.  Fact Sheet for Patients: BloggerCourse.com  Fact Sheet for Healthcare Providers: SeriousBroker.it  This test is not yet approved or cleared by the [place] FDA and has been authorized for detection and/or diagnosis of SARS-CoV-2 by FDA under an Emergency Use Authorization (EUA). This EUA will remain in effect (meaning this test can be used) for the duration of the COVID-19 declaration under Section 564(b)(1) of the Act, 21  U.S.C. section 360bbb-3(b)(1), unless the authorization is terminated or revoked.  Performed at [facility], [address] [place], [place] [address]      Labs: BNP (last 3 results) No results for input(s): BNP in the last 8760 hours. Basic Metabolic Panel: Recent Labs  Lab 09/17/20 0433 09/18/20 0522 09/19/20 0533 09/20/20 0517 09/21/20 0520  NA 137 137 139 137 141  K 3.8 4.4 3.7 4.0 3.9  CL 104 107 108 105 108  CO2 22 25 25 24 28   GLUCOSE 119* 150* 141* 126* 129*  BUN 8 8 12 17 18   CREATININE 0.57 0.60 0.58 0.60 0.70  CALCIUM 9.2 9.1 8.8* 8.7* 8.8*  MG  --  1.8 2.1 2.4  --   PHOS  --  3.8 4.0 3.8  --    Liver Function Tests: Recent Labs  Lab 09/17/20 0433  AST 22  ALT 35  ALKPHOS 53  BILITOT 0.7  PROT 7.4  ALBUMIN 3.6   No results for input(s): LIPASE, AMYLASE in the last 168 hours. No results for input(s): AMMONIA in the last 168 hours. CBC: Recent Labs  Lab 09/16/20 2115 09/17/20 0433 09/18/20 0522 09/19/20 0533 09/20/20 0746 09/21/20 0520  WBC 9.8 11.3* 7.4 11.3* 8.9 7.6  NEUTROABS 6.6  --   --   --   --   --  HGB 14.0 13.1 12.6 11.7* 11.9* 12.7  HCT 42.2 40.9 38.7 35.9* 36.5 38.0  MCV 83.1 84.0 81.6 83.7 82.4 82.6  PLT 213 221 195 198 187 231   Cardiac Enzymes: No results for input(s): CKTOTAL, CKMB, CKMBINDEX, TROPONINI in the last 168 hours. BNP: Invalid input(s): POCBNP CBG: Recent Labs  Lab 09/20/20 0012  GLUCAP 122*   D-Dimer No results for input(s): DDIMER in the last 72 hours. Hgb A1c No results for input(s): HGBA1C in the last 72 hours. Lipid Profile No results for input(s): CHOL, HDL, LDLCALC, TRIG, CHOLHDL, LDLDIRECT in the last 72 hours. Thyroid function studies No results for input(s): TSH, T4TOTAL, T3FREE, THYROIDAB in the last 72 hours.  Invalid input(s): FREET3 Anemia work up No results for input(s): VITAMINB12, FOLATE, FERRITIN, TIBC, IRON, RETICCTPCT in the last 72 hours. Urinalysis    Component  Value Date/Time   COLORURINE YELLOW 02/02/2015 1511   APPEARANCEUR CLEAR 02/02/2015 1511   LABSPEC 1.005 02/02/2015 1511   PHURINE 6.5 02/02/2015 1511   GLUCOSEU NEGATIVE 02/02/2015 1511   HGBUR SMALL (A) 02/02/2015 1511   BILIRUBINUR NEGATIVE 02/02/2015 1511   KETONESUR NEGATIVE 02/02/2015 1511   PROTEINUR 100 (A) 02/02/2015 1511   NITRITE NEGATIVE 02/02/2015 1511   LEUKOCYTESUR NEGATIVE 02/02/2015 1511   Sepsis Labs Invalid input(s): PROCALCITONIN,  WBC,  LACTICIDVEN Microbiology Recent Results (from the past 240 hour(s))  Resp Panel by RT-PCR (Flu A&B, Covid) Nasopharyngeal Swab     Status: None   Collection Time: 09/16/20 11:55 PM   Specimen: Nasopharyngeal Swab; Nasopharyngeal(NP) swabs in vial transport medium  Result Value Ref Range Status   SARS Coronavirus 2 by RT PCR NEGATIVE NEGATIVE Final    Comment: (NOTE) SARS-CoV-2 target nucleic acids are NOT DETECTED.  The SARS-CoV-2 RNA is generally detectable in upper respiratory specimens during the acute phase of infection. The lowest concentration of SARS-CoV-2 viral copies this assay can detect is 138 copies/mL. A negative result does not preclude SARS-Cov-2 infection and should not be used as the sole basis for treatment or other patient management decisions. A negative result may occur with  improper specimen collection/handling, submission of specimen other than nasopharyngeal swab, presence of viral mutation(s) within the areas targeted by this assay, and inadequate number of viral copies(<138 copies/mL). A negative result must be combined with clinical observations, patient history, and epidemiological information. The expected result is Negative.  Fact Sheet for Patients:  BloggerCourse.com  Fact Sheet for Healthcare Providers:  SeriousBroker.it  This test is no t yet approved or cleared by the [place] FDA and  has been authorized for detection and/or  diagnosis of SARS-CoV-2 by FDA under an Emergency Use Authorization (EUA). This EUA will remain  in effect (meaning this test can be used) for the duration of the COVID-19 declaration under Section 564(b)(1) of the Act, 21 U.S.C.section 360bbb-3(b)(1), unless the authorization is terminated  or revoked sooner.       Influenza A by PCR NEGATIVE NEGATIVE Final   Influenza B by PCR NEGATIVE NEGATIVE Final    Comment: (NOTE) The Xpert Xpress SARS-CoV-2/FLU/RSV plus assay is intended as an aid in the diagnosis of influenza from Nasopharyngeal swab specimens and should not be used as a sole basis for treatment. Nasal washings and aspirates are unacceptable for Xpert Xpress SARS-CoV-2/FLU/RSV testing.  Fact Sheet for Patients: BloggerCourse.com  Fact Sheet for Healthcare Providers: SeriousBroker.it  This test is not yet approved or cleared by the [place] FDA and has been authorized for detection and/or  diagnosis of SARS-CoV-2 by FDA under an Emergency Use Authorization (EUA). This EUA will remain in effect (meaning this test can be used) for the duration of the COVID-19 declaration under Section 564(b)(1) of the Act, 21 U.S.C. section 360bbb-3(b)(1), unless the authorization is terminated or revoked.  Performed at [facility], [address] [place], [place] [id number]      Time coordinating discharge: Over 30 minutes  SIGNED:   Tresa MooreSudheer B Lesha Jager, MD  [facility] 09/21/2020, 2:33 PM Pager   If 7PM-7AM, please contact night-coverage

## 2020-09-21 NOTE — Progress Notes (Signed)
Pt provided discharge instructions, all belongings sent with pt. All questions and concerns addressed at this time. VSS. Pt ambulating with steady gait at discharge.     09/21/20 1452  Vitals  Temp 98 F (36.7 C)  Temp Source Oral  BP 123/78  MAP (mmHg) 93  BP Location Left Arm  BP Method Automatic  Patient Position (if appropriate) Sitting  Pulse Rate (!) 53  Pulse Rate Source Monitor  Resp 16  Level of Consciousness  Level of Consciousness Alert  MEWS COLOR  MEWS Score Color Green  Oxygen Therapy  SpO2 100 %  O2 Device Room Air

## 2020-09-21 NOTE — Plan of Care (Signed)
I am asked to comment on the possibility of using apixaban instead of dabigatran for this patient with CVST  While data are sparse for use of oral anticoagulation in cerebral venous sinus thrombosis, a recent trial supports use of dabigatran (RE-SPECT CV) and a recent meta-analysis (citation below) supports use of any oral anticoagulation in this setting  Safety and efficacy of Direct Oral Anticoagulants in cerebral venous thrombosis: A meta-analysis Dominica et al.  https://onlinelibrary.wiley.com/doi/epdf/10.1111/ane.13506  Given poor insurance coverage for dabigatran for this patient, apixaban is a reasonable option.  She should have close follow-up with Dr. Malvin Johns and neurology as previously indicated for monitoring  Brooke Dare MD-PhD [facility] [phone]   [facility] coverage for [facility] is from 8 AM to 4 AM in-house and 4 PM to 8 PM by telephone/video. 8 PM to 8 AM emergent questions or overnight urgent questions should be addressed to Teleneurology On-call or [facility] neurohospitalist; contact information can be found on AMION

## 2020-09-21 NOTE — Progress Notes (Signed)
Subjective: Headache remains significantly improved She denies new visual complaints and reports her vision has improved since her admission She has substantial questions about her diagnoses today  Exam: Vitals:   09/20/20 2054 09/21/20 0400  BP: 134/78 135/80  Pulse: (!) 50 (!) 48  Resp: 17 16  Temp: 98.3 F (36.8 C) 98.1 F (36.7 C)  SpO2: 100% 100%   Gen: In bed, NAD Resp: non-labored breathing, no acute distress Abd: soft, nt  Neuro: MS: Awake, alert, interactive and appropriate CN: Pupils equal round and reactive to light, visual fields full, though she does report some spots when looking out of her right eye there is a very mild afferent pupillary defect of the right eye Motor: No drift Sensory: Intact light touch Coordination: Finger-to-nose is intact bilaterally  Pertinent Labs and data: Initial INR and PTT were normal. Factor V Leiden, prothrombin, antiphospholipid, protein C&S are pending ANA, anti-ENA(including SSA and SSB), antiphospholipid panel pending  MRI brain with and without contrast personally reviewed with patient at bedside today per her request  Impression: 30 year old female with a history of neurological disturbances since last December who presents with headache since Sunday.  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  Today I am additionally asked to comment on the possibility of using apixaban instead of dabigatran for this patient with CVST due to poor insurance coverage for dabigatran for this patient. While data are sparse for use of oral anticoagulation in cerebral venous sinus thrombosis, a recent trial supports use of dabigatran (RE-SPECT CV) and a recent meta-analysis (citation below) supports use of any oral anticoagulation in this setting ("Safety and efficacy of Direct Oral Anticoagulants in cerebral venous thrombosis: A meta-analysis" Dominica et  al. https://onlinelibrary.wiley.com/doi/epdf/10.1111/ane.13506)  Recommendations: -Can change from Lovenox to Pradaxa 150 mg twice daily or apixaban for CVT on discharge -S/p Solu-Medrol 1 g daily for total of 5 days, today is day 5 -She will need follow-up with Dr. Malvin Johns with neurology, in the interim I referred her to the [facility] MS Society website for further reliable information about her diagnosis and treatment options -Patient counseled to discontinue oral contraceptive pill -She will need follow-up with OB/GYN for alternative birth control such as IUD  Brooke Dare MD-PhD [facility] [phone]  [facility] coverage for [facility] is from 8 AM to 4 AM in-house and 4 PM to 8 PM by telephone/video. 8 PM to 8 AM emergent questions or overnight urgent questions should be addressed to Teleneurology On-call or [facility] neurohospitalist; contact information can be found on AMION  Greater than 35 minutes were spent in care of this patient today, greater than 50% of which was at bedside reviewing imaging with the patient, diagnosis of both CVST as well as multiple sclerosis, and answering her questions about treatment and monitoring, as well as return precautions

## 2020-09-23 LAB — ANTIPHOSPHOLIPID SYNDROME EVAL, BLD
Anticardiolipin IgA: <9 APL U/mL (ref 0–11)
Anticardiolipin IgG: <9 GPL U/mL (ref 0–14)
Anticardiolipin IgM: <9 MPL U/mL (ref 0–12)
DRVVT: 28.1 s (ref 0.0–47.0)
PTT Lupus Anticoagulant: 37.3 s (ref 0.0–51.9)
Phosphatydalserine, IgA: 1 APS Units (ref 0–19)
Phosphatydalserine, IgG: 9 Units (ref 0–30)
Phosphatydalserine, IgM: 10 Units (ref 0–30)

## 2020-09-23 LAB — FACTOR 5 LEIDEN

## 2020-09-25 DIAGNOSIS — R69 Illness, unspecified: Secondary | ICD-10-CM | POA: Diagnosis not present

## 2020-09-28 LAB — PROTHROMBIN GENE MUTATION

## 2020-09-30 ENCOUNTER — Telehealth: Payer: Self-pay | Admitting: *Deleted

## 2020-09-30 NOTE — Telephone Encounter (Signed)
Patient will contact her Neurologist as recommended though she states that her symptoms are more of a feeling of a sensation of felling a clot and not the headache, and dizziness. Otherwise she will keep her appointment with Dr B tomorrow or if gets worse will go to the ER

## 2020-09-30 NOTE — Telephone Encounter (Signed)
Patient called stating that she is having same symptoms (Headaches, Vertigo,Blurry vision) as she did when her clot was found and is asking if her appointment for tomorrow needs to be moved to today and if she is in any danger. She is on Eliquis. Please advise

## 2020-10-01 DIAGNOSIS — R69 Illness, unspecified: Secondary | ICD-10-CM | POA: Diagnosis not present

## 2020-10-02 ENCOUNTER — Inpatient Hospital Stay: Payer: 59 | Attending: Internal Medicine | Admitting: Internal Medicine

## 2020-10-02 ENCOUNTER — Encounter: Payer: Self-pay | Admitting: Internal Medicine

## 2020-10-02 DIAGNOSIS — G35 Multiple sclerosis: Secondary | ICD-10-CM | POA: Insufficient documentation

## 2020-10-02 DIAGNOSIS — R69 Illness, unspecified: Secondary | ICD-10-CM | POA: Diagnosis not present

## 2020-10-02 DIAGNOSIS — I82C11 Acute embolism and thrombosis of right internal jugular vein: Secondary | ICD-10-CM | POA: Insufficient documentation

## 2020-10-02 DIAGNOSIS — I676 Nonpyogenic thrombosis of intracranial venous system: Secondary | ICD-10-CM | POA: Insufficient documentation

## 2020-10-02 DIAGNOSIS — Z87891 Personal history of nicotine dependence: Secondary | ICD-10-CM | POA: Diagnosis not present

## 2020-10-02 DIAGNOSIS — G08 Intracranial and intraspinal phlebitis and thrombophlebitis: Secondary | ICD-10-CM | POA: Diagnosis not present

## 2020-10-02 DIAGNOSIS — I82890 Acute embolism and thrombosis of other specified veins: Secondary | ICD-10-CM | POA: Diagnosis not present

## 2020-10-02 DIAGNOSIS — Z7901 Long term (current) use of anticoagulants: Secondary | ICD-10-CM | POA: Diagnosis not present

## 2020-10-02 DIAGNOSIS — F1290 Cannabis use, unspecified, uncomplicated: Secondary | ICD-10-CM | POA: Insufficient documentation

## 2020-10-02 MED ORDER — APIXABAN 5 MG PO TABS: 5.0000 mg | ORAL_TABLET | Freq: Two times a day (BID) | ORAL | 4 refills | Status: DC

## 2020-10-02 NOTE — Progress Notes (Signed)
[facility] CONSULT NOTE  Patient Care Team: System, Provider Not In as PCP - General  CHIEF COMPLAINTS/PURPOSE OF CONSULTATION: Cerebral venous thrombosis  # July 2022-DVT-cerebral venous thrombosis /internal jugular sigmoid/transverse Veins; PROVOKED: OCP/? MS;hypercoagulable work-up-prothrombin gene mutation factor V Leiden/antiphospholipid antibody syndrome/protein C&S- NEG.   #Multiple sclerosis [Dx:july2022]- Dr.[facility] History   No history exists.     HISTORY OF PRESENTING ILLNESS:  Sophia Hampton 30 y.o.  female with new diagnosis of multiple sclerosis-not currently on therapy was recently admitted to hospital for worsening headaches/dizzy spells.  Patient MRI-showed cerebral venous thrombosis.  Patient was started on IV heparin.  Evaluated by neurology.  Patient discharged on Eliquis.  Patient denies any blood in stool black or stools.  Denies any nosebleeds or gum bleeding.  Denies any falls.  Complains of mild headache less than 1 on a scale of 10.   Review of Systems  Constitutional:  Negative for chills, diaphoresis, fever, malaise/fatigue and weight loss.  HENT:  Negative for nosebleeds and sore throat.   Eyes:  Negative for double vision.  Respiratory:  Negative for cough, hemoptysis, sputum production, shortness of breath and wheezing.   Cardiovascular:  Negative for chest pain, palpitations, orthopnea and leg swelling.  Gastrointestinal:  Negative for abdominal pain, blood in stool, constipation, diarrhea, heartburn, melena, nausea and vomiting.  Genitourinary:  Negative for dysuria, frequency and urgency.  Musculoskeletal:  Negative for back pain and joint pain.  Skin: Negative.  Negative for itching and rash.  Neurological:  Negative for dizziness, tingling, focal weakness, weakness and headaches.  Endo/Heme/Allergies:  Does not bruise/bleed easily.  Psychiatric/Behavioral:  Negative for depression. The patient is not nervous/anxious and  does not have insomnia.     MEDICAL HISTORY:  Past Medical History:  Diagnosis Date   Multiple sclerosis (HCC)     SURGICAL HISTORY: Past Surgical History:  Procedure Laterality Date   WISDOM TOOTH EXTRACTION      SOCIAL HISTORY: Social History   Socioeconomic History   Marital status: Single    Spouse name: Not on file   Number of children: Not on file   Years of education: Not on file   Highest education level: Not on file  Occupational History   Not on file  Tobacco Use   Smoking status: Former   Smokeless tobacco: Never  Substance and Sexual Activity   Alcohol use: Yes   Drug use: Yes    Types: Marijuana   Sexual activity: Yes    Birth control/protection: Pill  Other Topics Concern   Not on file  Social History Narrative   Not on file   Social Determinants of Health   Financial Resource Strain: Not on file  Food Insecurity: Not on file  Transportation Needs: Not on file  Physical Activity: Not on file  Stress: Not on file  Social Connections: Not on file  Intimate Partner Violence: Not on file    FAMILY HISTORY: Family History  Problem Relation Age of Onset   Cirrhosis Mother    COPD Mother    Alpha-1 antitrypsin deficiency Mother    Hyperlipidemia Father    Heart attack Father     ALLERGIES:  has No Known Allergies.  MEDICATIONS:  Current Outpatient Medications  Medication Sig Dispense Refill   apixaban (ELIQUIS) 5 MG TABS tablet Take 1 tablet (5 mg total) by mouth 2 (two) times daily. 60 tablet 4   APIXABAN (ELIQUIS) VTE STARTER PACK (10MG  AND 5MG ) Take as directed on package: start with  two-5mg  tablets twice daily for 7 days. On day 8, switch to one-5mg  tablet twice daily. 1 each 0   pantoprazole (PROTONIX) 40 MG tablet Take 1 tablet (40 mg total) by mouth daily for 14 days. 14 tablet 0   sertraline (ZOLOFT) 50 MG tablet Take 50 mg by mouth daily.     Turmeric (QC TUMERIC COMPLEX PO) Take 15 mLs by mouth at bedtime. (Patient not taking:  Reported on 10/02/2020)     No current facility-administered medications for this visit.      Marland Kitchen  PHYSICAL EXAMINATION: ECOG PERFORMANCE STATUS: 1 - Symptomatic but completely ambulatory  Vitals:   10/02/20 1408  BP: 127/83  Pulse: 90  Resp: 16  Temp: 98.2 F (36.8 C)  SpO2: 99%   Filed Weights   10/02/20 1408  Weight: 162 lb 3.2 oz (73.6 kg)    Physical Exam Vitals and nursing note reviewed.  Constitutional:      Comments: Patient ambulating independently.  Accompanied by her fianc.   HENT:     Head: Normocephalic and atraumatic.     Mouth/Throat:     Mouth: Mucous membranes are moist.     Pharynx: No oropharyngeal exudate.  Eyes:     Pupils: Pupils are equal, round, and reactive to light.  Cardiovascular:     Rate and Rhythm: Normal rate and regular rhythm.  Pulmonary:     Effort: No respiratory distress.     Breath sounds: No wheezing.     Comments: Decreased breath sounds bilaterally at bases.  No wheeze or crackles Abdominal:     General: Bowel sounds are normal. There is no distension.     Palpations: Abdomen is soft. There is no mass.     Tenderness: There is no abdominal tenderness. There is no guarding or rebound.  Musculoskeletal:        General: No tenderness. Normal range of motion.     Cervical back: Normal range of motion and neck supple.  Skin:    General: Skin is warm.  Neurological:     Mental Status: She is alert and oriented to person, place, and time.  Psychiatric:        Mood and Affect: Affect normal.        Judgment: Judgment normal.    LABORATORY DATA:  I have reviewed the data as listed Lab Results  Component Value Date   WBC 7.6 09/21/2020   HGB 12.7 09/21/2020   HCT 38.0 09/21/2020   MCV 82.6 09/21/2020   PLT 231 09/21/2020   Recent Labs    09/17/20 0433 09/18/20 0522 09/19/20 0533 09/20/20 0517 09/21/20 0520  NA 137   < > 139 137 141  K 3.8   < > 3.7 4.0 3.9  CL 104   < > 108 105 108  CO2 22   < > 25 24 28    GLUCOSE 119*   < > 141* 126* 129*  BUN 8   < > 12 17 18   CREATININE 0.57   < > 0.58 0.60 0.70  CALCIUM 9.2   < > 8.8* 8.7* 8.8*  GFRNONAA >60   < > >60 >60 >60  PROT 7.4  --   --   --   --   ALBUMIN 3.6  --   --   --   --   AST 22  --   --   --   --   ALT 35  --   --   --   --  ALKPHOS 53  --   --   --   --   BILITOT 0.7  --   --   --   --    < > = values in this interval not displayed.    RADIOGRAPHIC STUDIES: I have personally reviewed the radiological images as listed and agreed with the findings in the report. MR Brain W and Wo Contrast  Result Date: 09/16/2020 CLINICAL DATA:  Right-sided head pain. History of multiple sclerosis Multiple sclerosis, new event EXAM: MRI HEAD WITHOUT AND WITH CONTRAST TECHNIQUE: Multiplanar, multiecho pulse sequences of the brain and surrounding structures were obtained without and with intravenous contrast. CONTRAST:  69mL GADAVIST GADOBUTROL 1 MMOL/ML IV SOLN COMPARISON:  None. FINDINGS: Brain: No acute infarct, mass effect or extra-axial collection. There are 5-10 hyperintense T2-weighted signal lesions of the periventricular white matter. There is a contrast-enhancing lesion in the left frontal lobe (19:107). The midline structures are normal. Vascular: There is a large filling defect within the right transverse sinus that extends into the sigmoid sinus and internal jugular vein. Skull and upper cervical spine: Normal calvarium and skull base. Visualized upper cervical spine and soft tissues are normal. Sinuses/Orbits:No paranasal sinus fluid levels or advanced mucosal thickening. No mastoid or middle ear effusion. Normal orbits. IMPRESSION: 1. Contrast-enhancing lesion in the left frontal lobe consistent with active demyelination. 2. Thrombosis of the right transverse sinus, sigmoid sinus and internal jugular vein. 3. Multiple chronic white matter lesions in a pattern consistent multiple sclerosis Electronically Signed   By: Deatra Robinson M.D.   On:  09/16/2020 22:50   MR Cervical Spine W or Wo Contrast  Result Date: 09/17/2020 CLINICAL DATA:  Demyelinating disease. EXAM: MRI CERVICAL AND THORACIC SPINE WITHOUT AND WITH CONTRAST TECHNIQUE: Multiplanar and multiecho pulse sequences of the cervical spine, to include the craniocervical junction and cervicothoracic junction, and the thoracic spine, were obtained without and with intravenous contrast. CONTRAST:  14mL GADAVIST GADOBUTROL 1 MMOL/ML IV SOLN COMPARISON:  None. FINDINGS: MRI CERVICAL SPINE FINDINGS Alignment: Physiologic.  Reversal of normal lordotic curvature. Vertebrae: No fracture, evidence of discitis, or bone lesion. Cord: Normal signal and morphology. Posterior Fossa, vertebral arteries, paraspinal tissues: Negative. Disc levels: C1-2: Unremarkable. C2-3: Normal disc space and facet joints. There is no spinal canal stenosis. No neural foraminal stenosis. C3-4: Normal disc space and facet joints. There is no spinal canal stenosis. No neural foraminal stenosis. C4-5: Small central disc protrusion. There is no spinal canal stenosis. No neural foraminal stenosis. C5-6: Small left subarticular disc protrusion. There is no spinal canal stenosis. No neural foraminal stenosis. C6-7: Minimal disc bulge. There is no spinal canal stenosis. No neural foraminal stenosis. C7-T1: Normal disc space and facet joints. There is no spinal canal stenosis. No neural foraminal stenosis. MRI THORACIC SPINE FINDINGS Alignment:  Physiologic. Vertebrae: No fracture, evidence of discitis, or bone lesion. Cord:  Normal signal and morphology. Paraspinal and other soft tissues: Negative. Disc levels: No spinal canal or neural foraminal stenosis. IMPRESSION: 1. No evidence of demyelinating disease of the cervical or thoracic spine. 2. Mild cervical degenerative disc disease without spinal canal or neural foraminal stenosis. Electronically Signed   By: Deatra Robinson M.D.   On: 09/17/2020 23:41   MR THORACIC SPINE W WO  CONTRAST  Result Date: 09/17/2020 CLINICAL DATA:  Demyelinating disease. EXAM: MRI CERVICAL AND THORACIC SPINE WITHOUT AND WITH CONTRAST TECHNIQUE: Multiplanar and multiecho pulse sequences of the cervical spine, to include the craniocervical junction and cervicothoracic junction, and the  thoracic spine, were obtained without and with intravenous contrast. CONTRAST:  51mL GADAVIST GADOBUTROL 1 MMOL/ML IV SOLN COMPARISON:  None. FINDINGS: MRI CERVICAL SPINE FINDINGS Alignment: Physiologic.  Reversal of normal lordotic curvature. Vertebrae: No fracture, evidence of discitis, or bone lesion. Cord: Normal signal and morphology. Posterior Fossa, vertebral arteries, paraspinal tissues: Negative. Disc levels: C1-2: Unremarkable. C2-3: Normal disc space and facet joints. There is no spinal canal stenosis. No neural foraminal stenosis. C3-4: Normal disc space and facet joints. There is no spinal canal stenosis. No neural foraminal stenosis. C4-5: Small central disc protrusion. There is no spinal canal stenosis. No neural foraminal stenosis. C5-6: Small left subarticular disc protrusion. There is no spinal canal stenosis. No neural foraminal stenosis. C6-7: Minimal disc bulge. There is no spinal canal stenosis. No neural foraminal stenosis. C7-T1: Normal disc space and facet joints. There is no spinal canal stenosis. No neural foraminal stenosis. MRI THORACIC SPINE FINDINGS Alignment:  Physiologic. Vertebrae: No fracture, evidence of discitis, or bone lesion. Cord:  Normal signal and morphology. Paraspinal and other soft tissues: Negative. Disc levels: No spinal canal or neural foraminal stenosis. IMPRESSION: 1. No evidence of demyelinating disease of the cervical or thoracic spine. 2. Mild cervical degenerative disc disease without spinal canal or neural foraminal stenosis. Electronically Signed   By: Deatra Robinson M.D.   On: 09/17/2020 23:41    ASSESSMENT & PLAN:   Right jugular vein thrombosis    Cerebral venous  thrombosis #Cerebral venous thrombosis/deep vein thrombosis- of the right transverse sinus, sigmoid sinus and internal jugular vein.  Provoked-OCP; multiple sclerosis.  Recommend at least 6 months of anticoagulation.  Called in a prescription for Eliquis.  Had a long discussion with the patient regarding the risk of bleeding while on anticoagulation.  Patient counseled regarding taking at most care to avoid falls/hitting head. The patient should inform us of any bleeding episodes; or report to emergency room if any significant bleeding is noticed.  #Contraception/pregnancy: Once acute phase is over [3 months or so]; patient can go on nonestrogen OCP.  Patient will have to consider anticoagulation if and when she becomes pregnant/high estrogen state.   #Fatigue: Multifactorial likely secondary to multiple sclerosis versus others.   #Multiple sclerosis: New diagnosis as per patient.  MRI reviewed.  Patient has appointment with Dr. Malvin Johns next week.  # 25 minutes face-to-face with the patient discussing the above plan of care; more than 50% of time spent on prognosis/ natural history; counseling and coordination.  # DISPOSITION: # Follow up in 3 months- MD; labs- cbc/cmp-Dr.B  All questions were answered. The patient knows to call the clinic with any problems, questions or concerns.     Earna Coder, MD 10/03/2020 8:15 PM

## 2020-10-03 DIAGNOSIS — G08 Intracranial and intraspinal phlebitis and thrombophlebitis: Secondary | ICD-10-CM | POA: Insufficient documentation

## 2020-10-03 NOTE — Assessment & Plan Note (Signed)
#  Cerebral venous thrombosis/deep vein thrombosis- of the right transverse sinus, sigmoid sinus and internal jugular vein.  Provoked-OCP; multiple sclerosis.  Recommend at least 6 months of anticoagulation.  Called in a prescription for Eliquis.  Had a long discussion with the patient regarding the risk of bleeding while on anticoagulation.  Patient counseled regarding taking at most care to avoid falls/hitting head. The patient should inform us of any bleeding episodes; or report to emergency room if any significant bleeding is noticed.  #Contraception/pregnancy: Once acute phase is over [3 months or so]; patient can go on nonestrogen OCP.  Patient will have to consider anticoagulation if and when she becomes pregnant/high estrogen state.   #Fatigue: Multifactorial likely secondary to multiple sclerosis versus others.   #Multiple sclerosis: New diagnosis as per patient.  MRI reviewed.  Patient has appointment with Dr. Malvin Johns next week.  # 25 minutes face-to-face with the patient discussing the above plan of care; more than 50% of time spent on prognosis/ natural history; counseling and coordination.  # DISPOSITION: # Follow up in 3 months- MD; labs- cbc/cmp-Dr.B

## 2020-10-05 DIAGNOSIS — H539 Unspecified visual disturbance: Secondary | ICD-10-CM | POA: Diagnosis not present

## 2020-10-05 DIAGNOSIS — H5711 Ocular pain, right eye: Secondary | ICD-10-CM | POA: Diagnosis not present

## 2020-10-05 DIAGNOSIS — G35 Multiple sclerosis: Secondary | ICD-10-CM | POA: Diagnosis not present

## 2020-10-05 DIAGNOSIS — R519 Headache, unspecified: Secondary | ICD-10-CM | POA: Diagnosis not present

## 2020-10-06 DIAGNOSIS — G35 Multiple sclerosis: Secondary | ICD-10-CM | POA: Diagnosis not present

## 2020-10-06 DIAGNOSIS — Z79899 Other long term (current) drug therapy: Secondary | ICD-10-CM | POA: Diagnosis not present

## 2020-10-09 DIAGNOSIS — R69 Illness, unspecified: Secondary | ICD-10-CM | POA: Diagnosis not present

## 2020-10-09 DIAGNOSIS — Z6829 Body mass index (BMI) 29.0-29.9, adult: Secondary | ICD-10-CM | POA: Diagnosis not present

## 2020-10-09 DIAGNOSIS — D6869 Other thrombophilia: Secondary | ICD-10-CM | POA: Diagnosis not present

## 2020-10-09 DIAGNOSIS — Z23 Encounter for immunization: Secondary | ICD-10-CM | POA: Diagnosis not present

## 2020-10-09 DIAGNOSIS — L299 Pruritus, unspecified: Secondary | ICD-10-CM | POA: Diagnosis not present

## 2020-10-16 DIAGNOSIS — R69 Illness, unspecified: Secondary | ICD-10-CM | POA: Diagnosis not present

## 2020-10-21 DIAGNOSIS — H5711 Ocular pain, right eye: Secondary | ICD-10-CM | POA: Diagnosis not present

## 2020-10-21 DIAGNOSIS — H539 Unspecified visual disturbance: Secondary | ICD-10-CM | POA: Diagnosis not present

## 2020-10-21 DIAGNOSIS — R519 Headache, unspecified: Secondary | ICD-10-CM | POA: Diagnosis not present

## 2020-10-21 DIAGNOSIS — G35 Multiple sclerosis: Secondary | ICD-10-CM | POA: Diagnosis not present

## 2020-10-22 DIAGNOSIS — Z23 Encounter for immunization: Secondary | ICD-10-CM | POA: Diagnosis not present

## 2020-10-23 DIAGNOSIS — R69 Illness, unspecified: Secondary | ICD-10-CM | POA: Diagnosis not present

## 2020-10-30 DIAGNOSIS — R69 Illness, unspecified: Secondary | ICD-10-CM | POA: Diagnosis not present

## 2020-10-30 DIAGNOSIS — M62838 Other muscle spasm: Secondary | ICD-10-CM | POA: Diagnosis not present

## 2020-11-06 DIAGNOSIS — R69 Illness, unspecified: Secondary | ICD-10-CM | POA: Diagnosis not present

## 2020-11-11 ENCOUNTER — Encounter: Payer: Self-pay | Admitting: Gastroenterology

## 2020-11-12 ENCOUNTER — Telehealth: Payer: Self-pay | Admitting: *Deleted

## 2020-11-12 ENCOUNTER — Telehealth: Payer: Self-pay | Admitting: Internal Medicine

## 2020-11-12 NOTE — Telephone Encounter (Signed)
RN called [employer] and spoke with [place].  RN stated she needed to leave a message for Dr Marga Melnick to call Dr Donneta Romberg at [phone] regarding requested medical clearance on stated patient.  RN spoke with Lesly Rubenstein who stated doctor was not in office today but would text provider with Dr Sharlette Dense name, number and instruct to call ASAP.

## 2020-11-13 ENCOUNTER — Telehealth: Payer: Self-pay | Admitting: *Deleted

## 2020-11-13 ENCOUNTER — Encounter: Payer: Self-pay | Admitting: Internal Medicine

## 2020-11-13 DIAGNOSIS — R69 Illness, unspecified: Secondary | ICD-10-CM | POA: Diagnosis not present

## 2020-11-13 NOTE — Progress Notes (Signed)
I spoke to patient regarding her upcoming dental surgery (which seems to be a minor one) in the context of her ongoing anticoagulation for her cerebral venous thrombosis. Discussed that we will be happy to bridge the anticoagulation with lovenox to help proceed with her dental surgery.   Patient is leaning towards waiting to finish her anticoagulation/February 2023 to proceed with dental surgery.   Mild headache Recommend Ty lenol. If worse recommend reaching out to [place] or [place]. Period

## 2020-11-13 NOTE — Telephone Encounter (Signed)
Dr. B will reach out to the patient.

## 2020-11-13 NOTE — Telephone Encounter (Signed)
Patient requesting to speak with you about her oral surgery and Eliquis

## 2020-11-16 ENCOUNTER — Telehealth: Payer: Self-pay | Admitting: *Deleted

## 2020-11-16 NOTE — Telephone Encounter (Addendum)
Patient called requesting to speak with Dr B again regarding her discussion with her dentist re oral surgery.

## 2020-11-17 ENCOUNTER — Encounter: Payer: Self-pay | Admitting: Internal Medicine

## 2020-11-17 NOTE — Telephone Encounter (Signed)
Dr. B will call the patient.

## 2020-11-17 NOTE — Progress Notes (Signed)
Spoke to patient. As per the patient the dentist feels that the dental procedure is quite minor that she could get the dental procedure while on Eliquis. If the risk of bleeding is minimal on anticoagulation,  I would not Recommend any bridging anticoagulation. If patient has any major concerns recommend to call us back.

## 2020-11-20 DIAGNOSIS — R69 Illness, unspecified: Secondary | ICD-10-CM | POA: Diagnosis not present

## 2020-11-27 DIAGNOSIS — R69 Illness, unspecified: Secondary | ICD-10-CM | POA: Diagnosis not present

## 2020-12-04 DIAGNOSIS — R69 Illness, unspecified: Secondary | ICD-10-CM | POA: Diagnosis not present

## 2020-12-10 ENCOUNTER — Other Ambulatory Visit: Payer: 59

## 2020-12-10 ENCOUNTER — Ambulatory Visit: Payer: 59 | Admitting: Gastroenterology

## 2020-12-10 ENCOUNTER — Encounter: Payer: Self-pay | Admitting: Gastroenterology

## 2020-12-10 VITALS — BP 112/80 | HR 78 | Ht 62.0 in | Wt 168.0 lb

## 2020-12-10 DIAGNOSIS — R748 Abnormal levels of other serum enzymes: Secondary | ICD-10-CM

## 2020-12-10 DIAGNOSIS — Z8349 Family history of other endocrine, nutritional and metabolic diseases: Secondary | ICD-10-CM | POA: Diagnosis not present

## 2020-12-10 NOTE — Progress Notes (Signed)
Referring Provider: No ref. provider found Primary Care Physician:  Soundra Pilon, FNP  Reason for Consultation:  Change in bowel habits   IMPRESSION:  Elevated ALT x 1, now normalized: Discussed possibility of underlying liver disease. However, imaging in 2016 showed a normal liver. Would proceed with additional evaluation only if the liver enzymes increase and remain elevated for at least least 6. Reassurance provided today.  Family history of alpha-one- antitrypsin deficiency: Discussed screening options today. I encouraged her to review cost with her insurance plan prior to having the labs drawn to avoid surprising bills.   PLAN: - ?1-Antitrypsin Deficiency Profile - She will call me if her ALT increases again so that it can be further evaluated - Follow-up PRN  HPI: Sophia Hampton is a 30 y.o. female self-referred for change in bowel habits and and elevated liver enzymes.  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 She also has a history of anxiety, reflux, anemia as a child, and elevated cholesterol.   Her ALT was found to be elevated in July 2022.  She thought it remained elevated on repeat testing and was concerned her doctors were not concerned about the abnormalities. She did some research online and also wonders if her bowel movements have been pale, concerns her for liver disease. With reassurance of her liver enzymes, she notes that the GI symptoms were very mild and she does not feel that additional evaluation is needed.  She wants to be sure that she doesn't have underlying liver disease as they consider new medications to treatment her MS.   Mother with cirrhosis due to alpha one deficiency diagnosed when she presented with decompensated liver disease. She received care at [facility]. Genotype is unknown. No other known family history of liver disease or alpha-one deficiency.  She did a genetic test through [employer] but  the genetic test for alpha one was not present. She remains concerned about the possibility.  We reviewed labs today in Epic and through the electronic health record on her phone.  Labs are normal except for elevated serum glucose. In particular, Liver enzymes and CBC were normal 08/2020.   ALT 22 02/02/15 ALT 66 09/03/20 ALT 35 09/17/20 ALT 24 10/06/20  Remainder of her liver enzymes have been normal. She thought they were elevated in August but now acknowledges that they were normal.   Abdominal imaging to evaluate RLQ abdominal pain in 2016 included a CT abd/pelvis with contrast and transvaginal ultrasound. 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   No more recent abdominal imaging.   Past Medical History:  Diagnosis Date   Anemia    Anxiety    Blood clot of neck vein    Multiple sclerosis (HCC)     Past Surgical History:  Procedure Laterality Date   WISDOM TOOTH EXTRACTION       Allergies as of 12/10/2020   (No Known Allergies)    Family History  Problem Relation Age of Onset   Cirrhosis Mother    COPD Mother    Alpha-1 antitrypsin deficiency Mother    Hyperlipidemia Father    Heart attack Father    Prostate cancer Maternal Grandfather    Stroke Paternal Grandfather    Diabetes Maternal Aunt    Colon cancer Neg Hx    Stomach cancer Neg Hx    Esophageal cancer Neg Hx    Pancreatic cancer Neg Hx  Review of Systems: 12 system ROS is negative except as noted above with the addition of allergies, anxiety, vision changes, fatigue, itching, and concerns about swollen lymph nodes under her left arm. .   Physical Exam: General:   Alert,  well-nourished, pleasant and cooperative in NAD. There are no stigmata of chronic liver disease.  Head:  Normocephalic and atraumatic. Eyes:  Sclera clear, no icterus.   Conjunctiva pink. Ears:  Normal auditory acuity. Nose:  No deformity, discharge,  or lesions. Mouth:  No deformity or lesions.    Neck:  Supple; no masses or thyromegaly. Lungs:  Clear throughout to auscultation.   No wheezes. Heart:  Regular rate and rhythm; no murmurs. Abdomen:  Soft, nontender, nondistended, normal bowel sounds, no rebound or guarding. No hepatosplenomegaly.   Rectal:  Deferred  Msk:  Symmetrical. No boney deformities LAD: No inguinal or umbilical LAD Extremities:  No clubbing or edema. Neurologic:  Alert and  oriented x4;  grossly nonfocal Skin:  Intact without significant lesions or rashes. Psych:  Alert and cooperative. Normal mood and affect.    Sophia Hampton L. Orvan Falconer, MD, MPH 12/10/2020, 4:31 PM

## 2020-12-10 NOTE — Patient Instructions (Addendum)
If you are age 29 or younger, your body mass index should be between 19-25. Your Body mass index is 30.73 kg/m. If this is out of the aformentioned range listed, please consider follow up with your Primary Care Provider.  __________________________________________________________  The [facility] providers would like to encourage you to use [facility] to communicate with providers for non-urgent requests or questions.  Due to long hold times on the telephone, sending your provider a message by [facility] may be a faster and more efficient way to get a response.  Please allow 48 business hours for a response.  Please remember that this is for non-urgent requests.   Your provider has requested that you go to the basement level for lab work before leaving today. Press "B" on the elevator. The lab is located at the first door on the left as you exit the elevator.  Due to recent changes in healthcare laws, you may see the results of your imaging and laboratory studies on MyChart before your provider has had a chance to review them.  We understand that in some cases there may be results that are confusing or concerning to you. Not all laboratory results come back in the same time frame and the provider may be waiting for multiple results in order to interpret others.  Please give [place] 48 hours in order for your provider to thoroughly review all the results before contacting the office for clarification of your results.   Please call my office if ALT increases again so we can further evaluate.  Thank you for entrusting me with your care and choosing [facility].  Dr Orvan Falconer

## 2020-12-11 DIAGNOSIS — R69 Illness, unspecified: Secondary | ICD-10-CM | POA: Diagnosis not present

## 2020-12-14 ENCOUNTER — Encounter: Payer: Self-pay | Admitting: Internal Medicine

## 2020-12-14 NOTE — Progress Notes (Signed)
I spoke to patient regarding my discussion with dentist at [facility]. Patient feels comfortable holding off any dental procedures at this time. Continue current anticoagulation. Next follow up as pan.

## 2020-12-17 LAB — ALPHA-1-ANTITRYPSIN DEFICIENCY

## 2020-12-25 DIAGNOSIS — R69 Illness, unspecified: Secondary | ICD-10-CM | POA: Diagnosis not present

## 2020-12-28 ENCOUNTER — Other Ambulatory Visit: Payer: Self-pay | Admitting: *Deleted

## 2020-12-28 DIAGNOSIS — G08 Intracranial and intraspinal phlebitis and thrombophlebitis: Secondary | ICD-10-CM

## 2021-01-01 ENCOUNTER — Inpatient Hospital Stay: Payer: 59 | Attending: Internal Medicine

## 2021-01-01 ENCOUNTER — Other Ambulatory Visit: Payer: Self-pay

## 2021-01-01 ENCOUNTER — Inpatient Hospital Stay (HOSPITAL_BASED_OUTPATIENT_CLINIC_OR_DEPARTMENT_OTHER): Payer: 59 | Admitting: Internal Medicine

## 2021-01-01 VITALS — BP 107/75 | HR 78 | Temp 97.9°F | Resp 16 | Wt 167.6 lb

## 2021-01-01 DIAGNOSIS — Z87891 Personal history of nicotine dependence: Secondary | ICD-10-CM | POA: Diagnosis not present

## 2021-01-01 DIAGNOSIS — F1290 Cannabis use, unspecified, uncomplicated: Secondary | ICD-10-CM | POA: Insufficient documentation

## 2021-01-01 DIAGNOSIS — G08 Intracranial and intraspinal phlebitis and thrombophlebitis: Secondary | ICD-10-CM

## 2021-01-01 DIAGNOSIS — R69 Illness, unspecified: Secondary | ICD-10-CM | POA: Diagnosis not present

## 2021-01-01 DIAGNOSIS — G35 Multiple sclerosis: Secondary | ICD-10-CM | POA: Diagnosis not present

## 2021-01-01 DIAGNOSIS — I82C11 Acute embolism and thrombosis of right internal jugular vein: Secondary | ICD-10-CM | POA: Insufficient documentation

## 2021-01-01 DIAGNOSIS — Z7901 Long term (current) use of anticoagulants: Secondary | ICD-10-CM | POA: Diagnosis not present

## 2021-01-01 DIAGNOSIS — R519 Headache, unspecified: Secondary | ICD-10-CM | POA: Insufficient documentation

## 2021-01-01 DIAGNOSIS — I82890 Acute embolism and thrombosis of other specified veins: Secondary | ICD-10-CM

## 2021-01-01 DIAGNOSIS — I676 Nonpyogenic thrombosis of intracranial venous system: Secondary | ICD-10-CM | POA: Insufficient documentation

## 2021-01-01 LAB — COMPREHENSIVE METABOLIC PANEL
ALT: 27 U/L (ref 0–44)
AST: 18 U/L (ref 15–41)
Albumin: 4.2 g/dL (ref 3.5–5.0)
Alkaline Phosphatase: 57 U/L (ref 38–126)
Anion gap: 8 (ref 5–15)
BUN: 12 mg/dL (ref 6–20)
CO2: 28 mmol/L (ref 22–32)
Calcium: 9.2 mg/dL (ref 8.9–10.3)
Chloride: 101 mmol/L (ref 98–111)
Creatinine, Ser: 0.57 mg/dL (ref 0.44–1.00)
GFR, Estimated: >60 mL/min (ref >60)
Glucose, Bld: 94 mg/dL (ref 70–99)
Potassium: 3.9 mmol/L (ref 3.5–5.1)
Sodium: 137 mmol/L (ref 135–145)
Total Bilirubin: 0.6 mg/dL (ref 0.3–1.2)
Total Protein: 7.3 g/dL (ref 6.5–8.1)

## 2021-01-01 LAB — CBC WITH DIFFERENTIAL/PLATELET
Abs Immature Granulocytes: 0.02 10*3/uL (ref 0.00–0.07)
Basophils Absolute: 0.1 10*3/uL (ref 0.0–0.1)
Basophils Relative: 1 %
Eosinophils Absolute: 0.1 10*3/uL (ref 0.0–0.5)
Eosinophils Relative: 2 %
HCT: 41.7 % (ref 36.0–46.0)
Hemoglobin: 13.5 g/dL (ref 12.0–15.0)
Immature Granulocytes: 0 %
Lymphocytes Relative: 38 %
Lymphs Abs: 2.4 10*3/uL (ref 0.7–4.0)
MCH: 27.2 pg (ref 26.0–34.0)
MCHC: 32.4 g/dL (ref 30.0–36.0)
MCV: 83.9 fL (ref 80.0–100.0)
Monocytes Absolute: 0.4 10*3/uL (ref 0.1–1.0)
Monocytes Relative: 7 %
Neutro Abs: 3.3 10*3/uL (ref 1.7–7.7)
Neutrophils Relative %: 52 %
Platelets: 270 10*3/uL (ref 150–400)
RBC: 4.97 MIL/uL (ref 3.87–5.11)
RDW: 13.2 % (ref 11.5–15.5)
WBC: 6.3 10*3/uL (ref 4.0–10.5)
nRBC: 0 % (ref 0.0–0.2)

## 2021-01-01 NOTE — Progress Notes (Signed)
[facility] CONSULT NOTE  Patient Care Team: Soundra Pilon, FNP as PCP - General (Family Medicine)  CHIEF COMPLAINTS/PURPOSE OF CONSULTATION: Cerebral venous thrombosis  # July 21st 2022-DVT-cerebral venous thrombosis /internal jugular sigmoid/transverse Veins; PROVOKED: OCP/? MS;hypercoagulable work-up-prothrombin gene mutation factor V Leiden/antiphospholipid antibody syndrome/protein C&S- NEG.   #Multiple sclerosis [Dx:july2022]- Dr.[facility] History   No history exists.     HISTORY OF PRESENTING ILLNESS:  Sophia Hampton 30 y.o.  female with new diagnosis of multiple sclerosis/and history of cerebral venous thrombosis [while on OCP]-on Eliquis is here for follow-up.  After multiple discussion-with patient's dentist it was decided to hold off any dental procedures at this time.  Patient continues to be on Eliquis.  Patient denies any blood in stool black or stools.  Denies any nosebleeds or gum bleeding.  Denies any falls.  Complains of mild headache less than 1 on a scale of 10.   Review of Systems  Constitutional:  Negative for chills, diaphoresis, fever, malaise/fatigue and weight loss.  HENT:  Negative for nosebleeds and sore throat.   Eyes:  Negative for double vision.  Respiratory:  Negative for cough, hemoptysis, sputum production, shortness of breath and wheezing.   Cardiovascular:  Negative for chest pain, palpitations, orthopnea and leg swelling.  Gastrointestinal:  Negative for abdominal pain, blood in stool, constipation, diarrhea, heartburn, melena, nausea and vomiting.  Genitourinary:  Negative for dysuria, frequency and urgency.  Musculoskeletal:  Negative for back pain and joint pain.  Skin: Negative.  Negative for itching and rash.  Neurological:  Negative for dizziness, tingling, focal weakness, weakness and headaches.  Endo/Heme/Allergies:  Does not bruise/bleed easily.  Psychiatric/Behavioral:  Negative for depression. The patient is not  nervous/anxious and does not have insomnia.     MEDICAL HISTORY:  Past Medical History:  Diagnosis Date   Anemia    Anxiety    Blood clot of neck vein    Multiple sclerosis (HCC)     SURGICAL HISTORY: Past Surgical History:  Procedure Laterality Date   WISDOM TOOTH EXTRACTION      SOCIAL HISTORY: Social History   Socioeconomic History   Marital status: Single    Spouse name: Not on file   Number of children: Not on file   Years of education: Not on file   Highest education level: Not on file  Occupational History   Not on file  Tobacco Use   Smoking status: Former   Smokeless tobacco: Never  Vaping Use   Vaping Use: Never used  Substance and Sexual Activity   Alcohol use: Yes   Drug use: Yes    Types: Marijuana   Sexual activity: Yes    Birth control/protection: Pill  Other Topics Concern   Not on file  Social History Narrative   Not on file   Social Determinants of Health   Financial Resource Strain: Not on file  Food Insecurity: Not on file  Transportation Needs: Not on file  Physical Activity: Not on file  Stress: Not on file  Social Connections: Not on file  Intimate Partner Violence: Not on file    FAMILY HISTORY: Family History  Problem Relation Age of Onset   Cirrhosis Mother    COPD Mother    Alpha-1 antitrypsin deficiency Mother    Hyperlipidemia Father    Heart attack Father    Prostate cancer Maternal Grandfather    Stroke Paternal Grandfather    Diabetes Maternal Aunt    Colon cancer Neg Hx  Stomach cancer Neg Hx    Esophageal cancer Neg Hx    Pancreatic cancer Neg Hx     ALLERGIES:  has No Known Allergies.  MEDICATIONS:  Current Outpatient Medications  Medication Sig Dispense Refill   APIXABAN (ELIQUIS) VTE STARTER PACK (10MG  AND 5MG ) Take as directed on package: start with two-5mg  tablets twice daily for 7 days. On day 8, switch to one-5mg  tablet twice daily. 1 each 0   Cholecalciferol (VITAMIN D HIGH POTENCY) 25 MCG (1000  UT) capsule Take 1,000 Units by mouth daily. 4000 iu a day     sertraline (ZOLOFT) 50 MG tablet Take 50 mg by mouth daily.     No current facility-administered medications for this visit.       PHYSICAL EXAMINATION: ECOG PERFORMANCE STATUS: 1 - Symptomatic but completely ambulatory  Vitals:   01/01/21 1435  BP: 107/75  Pulse: 78  Resp: 16  Temp: 97.9 F (36.6 C)  SpO2: 100%   Filed Weights   01/01/21 1435  Weight: 167 lb 9.6 oz (76 kg)    Physical Exam Vitals and nursing note reviewed.  Constitutional:      Comments: Patient ambulating independently.  Accompanied by her fianc.   HENT:     Head: Normocephalic and atraumatic.     Mouth/Throat:     Mouth: Mucous membranes are moist.     Pharynx: No oropharyngeal exudate.  Eyes:     Pupils: Pupils are equal, round, and reactive to light.  Cardiovascular:     Rate and Rhythm: Normal rate and regular rhythm.  Pulmonary:     Effort: No respiratory distress.     Breath sounds: No wheezing.     Comments: Decreased breath sounds bilaterally at bases.  No wheeze or crackles Abdominal:     General: Bowel sounds are normal. There is no distension.     Palpations: Abdomen is soft. There is no mass.     Tenderness: There is no abdominal tenderness. There is no guarding or rebound.  Musculoskeletal:        General: No tenderness. Normal range of motion.     Cervical back: Normal range of motion and neck supple.  Skin:    General: Skin is warm.  Neurological:     Mental Status: She is alert and oriented to person, place, and time.  Psychiatric:        Mood and Affect: Affect normal.        Judgment: Judgment normal.    LABORATORY DATA:  I have reviewed the data as listed Lab Results  Component Value Date   WBC 6.3 01/01/2021   HGB 13.5 01/01/2021   HCT 41.7 01/01/2021   MCV 83.9 01/01/2021   PLT 270 01/01/2021   Recent Labs    09/17/20 0433 09/18/20 0522 09/20/20 0517 09/21/20 0520 01/01/21 1420  NA 137    < > 137 141 137  K 3.8   < > 4.0 3.9 3.9  CL 104   < > 105 108 101  CO2 22   < > 24 28 28   GLUCOSE 119*   < > 126* 129* 94  BUN 8   < > 17 18 12   CREATININE 0.57   < > 0.60 0.70 0.57  CALCIUM 9.2   < > 8.7* 8.8* 9.2  GFRNONAA >60   < > >60 >60 >60  PROT 7.4  --   --   --  7.3  ALBUMIN 3.6  --   --   --  4.2  AST 22  --   --   --  18  ALT 35  --   --   --  27  ALKPHOS 53  --   --   --  57  BILITOT 0.7  --   --   --  0.6   < > = values in this interval not displayed.    RADIOGRAPHIC STUDIES: I have personally reviewed the radiological images as listed and agreed with the findings in the report. No results found.  ASSESSMENT & PLAN:   Cerebral venous thrombosis #Cerebral venous thrombosis/deep vein thrombosis- of the right transverse sinus, sigmoid sinus and internal jugular vein.  Provoked-OCP/ multiple sclerosis.  Currently off OCPs.  [6 months of anticoagulation- until end of Jan 2023.  Continue Eliquis.  Patient will call for refills if needed.  #Contraception/pregnancy: Declines any further contraception at this time.  Patient will have to consider anticoagulation if and when she becomes pregnant/high estrogen state.   #Multiple sclerosis: awaiting on Tysabri [neurology]  # DISPOSITION: # Follow up in last week of JAN 2023- MD; labs- cbc/cmp-Dr.B  All questions were answered. The patient knows to call the clinic with any problems, questions or concerns.     Earna Coder, MD 01/01/2021 4:22 PM

## 2021-01-01 NOTE — Assessment & Plan Note (Addendum)
#  Cerebral venous thrombosis/deep vein thrombosis- of the right transverse sinus, sigmoid sinus and internal jugular vein.  Provoked-OCP/ multiple sclerosis.  Currently off OCPs.  [6 months of anticoagulation- until end of Jan 2023.  Continue Eliquis.  Patient will call for refills if needed.  #Contraception/pregnancy: Declines any further contraception at this time.  Patient will have to consider anticoagulation if and when she becomes pregnant/high estrogen state.   #Multiple sclerosis: awaiting on Tysabri [neurology]  # DISPOSITION: # Follow up in last week of JAN 2023- MD; labs- cbc/cmp-Dr.B

## 2021-01-01 NOTE — Progress Notes (Signed)
Pt c/o knot to right back of neck as well as intermittent headache with spontaneous resolution without the need for medication.

## 2021-01-08 DIAGNOSIS — R69 Illness, unspecified: Secondary | ICD-10-CM | POA: Diagnosis not present

## 2021-01-15 DIAGNOSIS — R69 Illness, unspecified: Secondary | ICD-10-CM | POA: Diagnosis not present

## 2021-01-29 DIAGNOSIS — R69 Illness, unspecified: Secondary | ICD-10-CM | POA: Diagnosis not present

## 2021-02-12 DIAGNOSIS — G35 Multiple sclerosis: Secondary | ICD-10-CM | POA: Diagnosis not present

## 2021-02-12 DIAGNOSIS — I676 Nonpyogenic thrombosis of intracranial venous system: Secondary | ICD-10-CM | POA: Diagnosis not present

## 2021-02-12 DIAGNOSIS — R69 Illness, unspecified: Secondary | ICD-10-CM | POA: Diagnosis not present

## 2021-02-12 DIAGNOSIS — E669 Obesity, unspecified: Secondary | ICD-10-CM | POA: Diagnosis not present

## 2021-02-12 DIAGNOSIS — Z7901 Long term (current) use of anticoagulants: Secondary | ICD-10-CM | POA: Diagnosis not present

## 2021-02-12 DIAGNOSIS — Z8249 Family history of ischemic heart disease and other diseases of the circulatory system: Secondary | ICD-10-CM | POA: Diagnosis not present

## 2021-02-12 DIAGNOSIS — Z87891 Personal history of nicotine dependence: Secondary | ICD-10-CM | POA: Diagnosis not present

## 2021-02-12 DIAGNOSIS — Z86718 Personal history of other venous thrombosis and embolism: Secondary | ICD-10-CM | POA: Diagnosis not present

## 2021-02-16 ENCOUNTER — Encounter: Payer: Self-pay | Admitting: Internal Medicine

## 2021-02-19 DIAGNOSIS — R69 Illness, unspecified: Secondary | ICD-10-CM | POA: Diagnosis not present

## 2021-02-26 DIAGNOSIS — R69 Illness, unspecified: Secondary | ICD-10-CM | POA: Diagnosis not present

## 2021-03-05 DIAGNOSIS — F411 Generalized anxiety disorder: Secondary | ICD-10-CM | POA: Diagnosis not present

## 2021-03-12 DIAGNOSIS — F411 Generalized anxiety disorder: Secondary | ICD-10-CM | POA: Diagnosis not present

## 2021-03-16 ENCOUNTER — Other Ambulatory Visit: Payer: Self-pay | Admitting: Internal Medicine

## 2021-03-17 NOTE — Telephone Encounter (Signed)
Pt will need eliquis until end of JAN, 2023; and she will finish her course of anti-coagulation. GB

## 2021-03-19 DIAGNOSIS — F411 Generalized anxiety disorder: Secondary | ICD-10-CM | POA: Diagnosis not present

## 2021-03-22 ENCOUNTER — Encounter: Payer: Self-pay | Admitting: Internal Medicine

## 2021-03-22 ENCOUNTER — Inpatient Hospital Stay: Payer: BC Managed Care – PPO | Attending: Internal Medicine

## 2021-03-22 ENCOUNTER — Other Ambulatory Visit: Payer: Self-pay

## 2021-03-22 ENCOUNTER — Inpatient Hospital Stay: Payer: BC Managed Care – PPO | Admitting: Internal Medicine

## 2021-03-22 DIAGNOSIS — Z87891 Personal history of nicotine dependence: Secondary | ICD-10-CM | POA: Insufficient documentation

## 2021-03-22 DIAGNOSIS — Z86718 Personal history of other venous thrombosis and embolism: Secondary | ICD-10-CM | POA: Diagnosis not present

## 2021-03-22 DIAGNOSIS — G08 Intracranial and intraspinal phlebitis and thrombophlebitis: Secondary | ICD-10-CM | POA: Diagnosis not present

## 2021-03-22 DIAGNOSIS — Z79899 Other long term (current) drug therapy: Secondary | ICD-10-CM | POA: Insufficient documentation

## 2021-03-22 DIAGNOSIS — I82890 Acute embolism and thrombosis of other specified veins: Secondary | ICD-10-CM

## 2021-03-22 DIAGNOSIS — G35 Multiple sclerosis: Secondary | ICD-10-CM | POA: Diagnosis not present

## 2021-03-22 DIAGNOSIS — Z7901 Long term (current) use of anticoagulants: Secondary | ICD-10-CM | POA: Insufficient documentation

## 2021-03-22 LAB — COMPREHENSIVE METABOLIC PANEL
ALT: 29 U/L (ref 0–44)
AST: 20 U/L (ref 15–41)
Albumin: 4.4 g/dL (ref 3.5–5.0)
Alkaline Phosphatase: 62 U/L (ref 38–126)
Anion gap: 9 (ref 5–15)
BUN: 12 mg/dL (ref 6–20)
CO2: 29 mmol/L (ref 22–32)
Calcium: 9.6 mg/dL (ref 8.9–10.3)
Chloride: 99 mmol/L (ref 98–111)
Creatinine, Ser: 0.59 mg/dL (ref 0.44–1.00)
GFR, Estimated: >60 mL/min (ref >60)
Glucose, Bld: 82 mg/dL (ref 70–99)
Potassium: 3.9 mmol/L (ref 3.5–5.1)
Sodium: 137 mmol/L (ref 135–145)
Total Bilirubin: 0.3 mg/dL (ref 0.3–1.2)
Total Protein: 7.7 g/dL (ref 6.5–8.1)

## 2021-03-22 LAB — CBC WITH DIFFERENTIAL/PLATELET
Abs Immature Granulocytes: 0.02 10*3/uL (ref 0.00–0.07)
Basophils Absolute: 0.1 10*3/uL (ref 0.0–0.1)
Basophils Relative: 1 %
Eosinophils Absolute: 0.1 10*3/uL (ref 0.0–0.5)
Eosinophils Relative: 2 %
HCT: 41.6 % (ref 36.0–46.0)
Hemoglobin: 13.1 g/dL (ref 12.0–15.0)
Immature Granulocytes: 0 %
Lymphocytes Relative: 41 %
Lymphs Abs: 2.6 10*3/uL (ref 0.7–4.0)
MCH: 26.5 pg (ref 26.0–34.0)
MCHC: 31.5 g/dL (ref 30.0–36.0)
MCV: 84 fL (ref 80.0–100.0)
Monocytes Absolute: 0.4 10*3/uL (ref 0.1–1.0)
Monocytes Relative: 6 %
Neutro Abs: 3.2 10*3/uL (ref 1.7–7.7)
Neutrophils Relative %: 50 %
Platelets: 334 10*3/uL (ref 150–400)
RBC: 4.95 MIL/uL (ref 3.87–5.11)
RDW: 13.2 % (ref 11.5–15.5)
WBC: 6.4 10*3/uL (ref 4.0–10.5)
nRBC: 0 % (ref 0.0–0.2)

## 2021-03-22 NOTE — Progress Notes (Signed)
[facility] CONSULT NOTE  Patient Care Team: Soundra Pilon, FNP as PCP - General (Family Medicine)  CHIEF COMPLAINTS/PURPOSE OF CONSULTATION: Cerebral venous thrombosis  # July 21st 2022-DVT-cerebral venous thrombosis /internal jugular sigmoid/transverse Veins; PROVOKED: OCP/? MS;hypercoagulable work-up-prothrombin gene mutation factor V Leiden/antiphospholipid antibody syndrome/protein C&S- NEG x recommend stop anticoagulation end of JAN, 2023  #Multiple sclerosis [Dx:july2022]- [facility] [[facility] History   No history exists.     HISTORY OF PRESENTING ILLNESS: Patient ambulating independently.  Accompanied by her fianc.  Sophia Hampton 31 y.o.  female with new diagnosis of multiple sclerosis/and history of cerebral venous thrombosis [while on OCP]-on Eliquis is here for follow-up.  In the interim patient had extraction of her tooth while on Eliquis.  Did not have any significant bleeding.  She is currently awaiting to start treatment for her multiple sclerosis [facility] neurology-neurology.]  Denies any headaches.  Review of Systems  Constitutional:  Negative for chills, diaphoresis, fever, malaise/fatigue and weight loss.  HENT:  Negative for nosebleeds and sore throat.   Eyes:  Negative for double vision.  Respiratory:  Negative for cough, hemoptysis, sputum production, shortness of breath and wheezing.   Cardiovascular:  Negative for chest pain, palpitations, orthopnea and leg swelling.  Gastrointestinal:  Negative for abdominal pain, blood in stool, constipation, diarrhea, heartburn, melena, nausea and vomiting.  Genitourinary:  Negative for dysuria, frequency and urgency.  Musculoskeletal:  Negative for back pain and joint pain.  Skin: Negative.  Negative for itching and rash.  Neurological:  Negative for dizziness, tingling, focal weakness, weakness and headaches.  Endo/Heme/Allergies:  Does not bruise/bleed easily.  Psychiatric/Behavioral:  Negative  for depression. The patient is not nervous/anxious and does not have insomnia.     MEDICAL HISTORY:  Past Medical History:  Diagnosis Date   Anemia    Anxiety    Blood clot of neck vein    Multiple sclerosis (HCC)     SURGICAL HISTORY: Past Surgical History:  Procedure Laterality Date   WISDOM TOOTH EXTRACTION      SOCIAL HISTORY: Social History   Socioeconomic History   Marital status: Single    Spouse name: Not on file   Number of children: Not on file   Years of education: Not on file   Highest education level: Not on file  Occupational History   Not on file  Tobacco Use   Smoking status: Former   Smokeless tobacco: Never  Vaping Use   Vaping Use: Never used  Substance and Sexual Activity   Alcohol use: Yes   Drug use: Yes    Types: Marijuana   Sexual activity: Yes    Birth control/protection: Pill  Other Topics Concern   Not on file  Social History Narrative   Not on file   Social Determinants of Health   Financial Resource Strain: Not on file  Food Insecurity: Not on file  Transportation Needs: Not on file  Physical Activity: Not on file  Stress: Not on file  Social Connections: Not on file  Intimate Partner Violence: Not on file    FAMILY HISTORY: Family History  Problem Relation Age of Onset   Cirrhosis Mother    COPD Mother    Alpha-1 antitrypsin deficiency Mother    Hyperlipidemia Father    Heart attack Father    Prostate cancer Maternal Grandfather    Stroke Paternal Grandfather    Diabetes Maternal Aunt    Colon cancer Neg Hx    Stomach cancer Neg Hx  Esophageal cancer Neg Hx    Pancreatic cancer Neg Hx     ALLERGIES:  has No Known Allergies.  MEDICATIONS:  Current Outpatient Medications  Medication Sig Dispense Refill   APIXABAN (ELIQUIS) VTE STARTER PACK (10MG  AND 5MG ) Take as directed on package: start with two-5mg  tablets twice daily for 7 days. On day 8, switch to one-5mg  tablet twice daily. 1 each 0   Cholecalciferol  (VITAMIN D HIGH POTENCY) 25 MCG (1000 UT) capsule Take 1,000 Units by mouth daily. 4000 iu a day     ELIQUIS 5 MG TABS tablet TAKE 1 TABLET BY MOUTH TWICE A DAY 24 tablet 0   sertraline (ZOLOFT) 50 MG tablet Take 50 mg by mouth daily.     No current facility-administered medications for this visit.       PHYSICAL EXAMINATION: ECOG PERFORMANCE STATUS: 1 - Symptomatic but completely ambulatory  Vitals:   03/22/21 1449  BP: 102/71  Pulse: (!) 58  Temp: (!) 96.5 F (35.8 C)   Filed Weights   03/22/21 1449  Weight: 168 lb 9.6 oz (76.5 kg)    Physical Exam Vitals and nursing note reviewed.  HENT:     Head: Normocephalic and atraumatic.     Mouth/Throat:     Mouth: Mucous membranes are moist.     Pharynx: No oropharyngeal exudate.  Eyes:     Pupils: Pupils are equal, round, and reactive to light.  Cardiovascular:     Rate and Rhythm: Normal rate and regular rhythm.  Pulmonary:     Effort: No respiratory distress.     Breath sounds: No wheezing.     Comments: Decreased breath sounds bilaterally at bases.  No wheeze or crackles Abdominal:     General: Bowel sounds are normal. There is no distension.     Palpations: Abdomen is soft. There is no mass.     Tenderness: There is no abdominal tenderness. There is no guarding or rebound.  Musculoskeletal:        General: No tenderness. Normal range of motion.     Cervical back: Normal range of motion and neck supple.  Skin:    General: Skin is warm.  Neurological:     Mental Status: She is alert and oriented to person, place, and time.  Psychiatric:        Mood and Affect: Affect normal.        Judgment: Judgment normal.    LABORATORY DATA:  I have reviewed the data as listed Lab Results  Component Value Date   WBC 6.4 03/22/2021   HGB 13.1 03/22/2021   HCT 41.6 03/22/2021   MCV 84.0 03/22/2021   PLT 334 03/22/2021   Recent Labs    09/17/20 0433 09/18/20 0522 09/21/20 0520 01/01/21 1420 03/22/21 1426  NA 137    < > 141 137 137  K 3.8   < > 3.9 3.9 3.9  CL 104   < > 108 101 99  CO2 22   < > 28 28 29   GLUCOSE 119*   < > 129* 94 82  BUN 8   < > 18 12 12   CREATININE 0.57   < > 0.70 0.57 0.59  CALCIUM 9.2   < > 8.8* 9.2 9.6  GFRNONAA >60   < > >60 >60 >60  PROT 7.4  --   --  7.3 7.7  ALBUMIN 3.6  --   --  4.2 4.4  AST 22  --   --  18  20  ALT 35  --   --  27 29  ALKPHOS 53  --   --  57 62  BILITOT 0.7  --   --  0.6 0.3   < > = values in this interval not displayed.    RADIOGRAPHIC STUDIES: I have personally reviewed the radiological images as listed and agreed with the findings in the report. No results found.  ASSESSMENT & PLAN:   Cerebral venous thrombosis # Cerebral venous thrombosis/deep vein thrombosis- of the right transverse sinus, sigmoid sinus and internal jugular vein.  Provoked-OCP/ multiple sclerosis.  Patient will finish anticoagulation end of January 2023. Awaiting repeating MRI jan 30th with Neurology.   # Contraception/pregnancy: Declines any further contraception at this time.  Patient will have to consider anticoagulation if and when she becomes pregnant/high estrogen state.   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    # DISPOSITION: # Follow up as needed-Dr.B  All questions were answered. The patient knows to call the clinic with any problems, questions or concerns.     Earna Coder, MD 03/22/2021 3:30 PM

## 2021-03-22 NOTE — Assessment & Plan Note (Addendum)
#   Cerebral venous thrombosis/deep vein thrombosis- of the right transverse sinus, sigmoid sinus and internal jugular vein.  Provoked-OCP/ multiple sclerosis.  Patient will finish anticoagulation end of January 2023. Awaiting repeating MRI jan 30th with Neurology.   # Contraception/pregnancy: Declines any further contraception at this time.  Patient will have to consider anticoagulation if and when she becomes pregnant/high estrogen state.   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    # DISPOSITION: # Follow up as needed-Dr.B

## 2021-03-24 DIAGNOSIS — F411 Generalized anxiety disorder: Secondary | ICD-10-CM | POA: Diagnosis not present

## 2021-03-29 ENCOUNTER — Encounter: Payer: Self-pay | Admitting: Internal Medicine

## 2021-03-31 DIAGNOSIS — F411 Generalized anxiety disorder: Secondary | ICD-10-CM | POA: Diagnosis not present

## 2021-04-02 DIAGNOSIS — Z86718 Personal history of other venous thrombosis and embolism: Secondary | ICD-10-CM | POA: Diagnosis not present

## 2021-04-02 DIAGNOSIS — G08 Intracranial and intraspinal phlebitis and thrombophlebitis: Secondary | ICD-10-CM | POA: Diagnosis not present

## 2021-04-07 DIAGNOSIS — F411 Generalized anxiety disorder: Secondary | ICD-10-CM | POA: Diagnosis not present

## 2021-04-21 DIAGNOSIS — F411 Generalized anxiety disorder: Secondary | ICD-10-CM | POA: Diagnosis not present

## 2021-04-28 ENCOUNTER — Other Ambulatory Visit: Payer: Self-pay

## 2021-04-28 ENCOUNTER — Encounter: Payer: Self-pay | Admitting: Emergency Medicine

## 2021-04-28 DIAGNOSIS — R0789 Other chest pain: Secondary | ICD-10-CM | POA: Diagnosis not present

## 2021-04-28 DIAGNOSIS — R509 Fever, unspecified: Secondary | ICD-10-CM | POA: Diagnosis not present

## 2021-04-28 DIAGNOSIS — R079 Chest pain, unspecified: Secondary | ICD-10-CM | POA: Diagnosis not present

## 2021-04-28 DIAGNOSIS — N9489 Other specified conditions associated with female genital organs and menstrual cycle: Secondary | ICD-10-CM | POA: Insufficient documentation

## 2021-04-28 DIAGNOSIS — Z7901 Long term (current) use of anticoagulants: Secondary | ICD-10-CM | POA: Diagnosis not present

## 2021-04-28 DIAGNOSIS — F411 Generalized anxiety disorder: Secondary | ICD-10-CM | POA: Diagnosis not present

## 2021-04-28 DIAGNOSIS — J029 Acute pharyngitis, unspecified: Secondary | ICD-10-CM | POA: Insufficient documentation

## 2021-04-28 DIAGNOSIS — R21 Rash and other nonspecific skin eruption: Secondary | ICD-10-CM | POA: Insufficient documentation

## 2021-04-28 DIAGNOSIS — T370X5A Adverse effect of sulfonamides, initial encounter: Secondary | ICD-10-CM | POA: Diagnosis not present

## 2021-04-28 DIAGNOSIS — I639 Cerebral infarction, unspecified: Secondary | ICD-10-CM | POA: Diagnosis not present

## 2021-04-28 DIAGNOSIS — Z20822 Contact with and (suspected) exposure to covid-19: Secondary | ICD-10-CM | POA: Diagnosis not present

## 2021-04-28 DIAGNOSIS — R0602 Shortness of breath: Secondary | ICD-10-CM | POA: Diagnosis not present

## 2021-04-28 NOTE — ED Triage Notes (Signed)
Pt to ED from home c/o allergic reaction.  States dx of MS back in July.  Started new med, Billey Gosling, on Saturday and started to have itching, rash and feeling in her throat since Monday.  Took benadryl last night and zyrtec today.  States headache as well and sore throat.  Some rash noted to wrists and shoulder, states rash worse to thighs and groin, speaking in complete and coherent sentences and maintaining secretions, chest rise even and unlabored in NAD at this time. ?

## 2021-04-29 ENCOUNTER — Emergency Department: Payer: BC Managed Care – PPO

## 2021-04-29 ENCOUNTER — Emergency Department
Admission: EM | Admit: 2021-04-29 | Discharge: 2021-04-29 | Disposition: A | Discharge status: Home / Self Care | Payer: BC Managed Care – PPO | Attending: Emergency Medicine | Admitting: Emergency Medicine

## 2021-04-29 DIAGNOSIS — R0602 Shortness of breath: Secondary | ICD-10-CM | POA: Diagnosis not present

## 2021-04-29 DIAGNOSIS — I639 Cerebral infarction, unspecified: Secondary | ICD-10-CM | POA: Diagnosis not present

## 2021-04-29 DIAGNOSIS — R21 Rash and other nonspecific skin eruption: Secondary | ICD-10-CM

## 2021-04-29 DIAGNOSIS — J029 Acute pharyngitis, unspecified: Secondary | ICD-10-CM

## 2021-04-29 DIAGNOSIS — R079 Chest pain, unspecified: Secondary | ICD-10-CM | POA: Diagnosis not present

## 2021-04-29 DIAGNOSIS — R509 Fever, unspecified: Secondary | ICD-10-CM

## 2021-04-29 DIAGNOSIS — R0789 Other chest pain: Secondary | ICD-10-CM

## 2021-04-29 DIAGNOSIS — T50905A Adverse effect of unspecified drugs, medicaments and biological substances, initial encounter: Secondary | ICD-10-CM

## 2021-04-29 LAB — CBC WITH DIFFERENTIAL/PLATELET
Abs Immature Granulocytes: 0.02 10*3/uL (ref 0.00–0.07)
Basophils Absolute: 0 10*3/uL (ref 0.0–0.1)
Basophils Relative: 0 %
Eosinophils Absolute: 0 10*3/uL (ref 0.0–0.5)
Eosinophils Relative: 1 %
HCT: 42.9 % (ref 36.0–46.0)
Hemoglobin: 13.5 g/dL (ref 12.0–15.0)
Immature Granulocytes: 0 %
Lymphocytes Relative: 20 %
Lymphs Abs: 1.5 10*3/uL (ref 0.7–4.0)
MCH: 26.1 pg (ref 26.0–34.0)
MCHC: 31.5 g/dL (ref 30.0–36.0)
MCV: 83 fL (ref 80.0–100.0)
Monocytes Absolute: 0.4 10*3/uL (ref 0.1–1.0)
Monocytes Relative: 5 %
Neutro Abs: 5.7 10*3/uL (ref 1.7–7.7)
Neutrophils Relative %: 74 %
Platelets: 262 10*3/uL (ref 150–400)
RBC: 5.17 MIL/uL — ABNORMAL HIGH (ref 3.87–5.11)
RDW: 13.3 % (ref 11.5–15.5)
WBC: 7.6 10*3/uL (ref 4.0–10.5)
nRBC: 0 % (ref 0.0–0.2)

## 2021-04-29 LAB — COMPREHENSIVE METABOLIC PANEL
ALT: 35 U/L (ref 0–44)
AST: 28 U/L (ref 15–41)
Albumin: 4 g/dL (ref 3.5–5.0)
Alkaline Phosphatase: 59 U/L (ref 38–126)
Anion gap: 5 (ref 5–15)
BUN: 9 mg/dL (ref 6–20)
CO2: 28 mmol/L (ref 22–32)
Calcium: 9.2 mg/dL (ref 8.9–10.3)
Chloride: 105 mmol/L (ref 98–111)
Creatinine, Ser: 0.65 mg/dL (ref 0.44–1.00)
GFR, Estimated: >60 mL/min (ref >60)
Glucose, Bld: 102 mg/dL — ABNORMAL HIGH (ref 70–99)
Potassium: 3.9 mmol/L (ref 3.5–5.1)
Sodium: 138 mmol/L (ref 135–145)
Total Bilirubin: 0.4 mg/dL (ref 0.3–1.2)
Total Protein: 7.5 g/dL (ref 6.5–8.1)

## 2021-04-29 LAB — RESP PANEL BY RT-PCR (FLU A&B, COVID) ARPGX2
Influenza A by PCR: NEGATIVE
Influenza B by PCR: NEGATIVE
SARS Coronavirus 2 by RT PCR: NEGATIVE

## 2021-04-29 LAB — URINALYSIS, ROUTINE W REFLEX MICROSCOPIC
Bilirubin Urine: NEGATIVE
Glucose, UA: NEGATIVE mg/dL
Hgb urine dipstick: NEGATIVE
Ketones, ur: 5 mg/dL — AB
Leukocytes,Ua: NEGATIVE
Nitrite: NEGATIVE
Protein, ur: NEGATIVE mg/dL
Specific Gravity, Urine: 1.017 (ref 1.005–1.030)
pH: 7 (ref 5.0–8.0)

## 2021-04-29 LAB — HCG, QUANTITATIVE, PREGNANCY: hCG, Beta Chain, Quant, S: <1 m[IU]/mL (ref <5)

## 2021-04-29 LAB — MONONUCLEOSIS SCREEN: Mono Screen: NEGATIVE

## 2021-04-29 LAB — TROPONIN I (HIGH SENSITIVITY): Troponin I (High Sensitivity): <2 ng/L (ref <18)

## 2021-04-29 LAB — GROUP A STREP BY PCR: Group A Strep by PCR: NOT DETECTED

## 2021-04-29 IMAGING — CT CT VENOGRAM HEAD
2 of 11 series · 8 of 47 positions shown · IV contrast (APPLIED)
Comparison: None.

CLINICAL DATA: Headache

EXAM:
CT VENOGRAM HEAD
TECHNIQUE: Venographic phase images of the brain were obtained following the
administration of intravenous contrast. Multiplanar reformats and
maximum intensity projections were generated.

[Series 3: head bone · axial · 0.41mm/px · z∈[-78,+12]mm · 4 of 76 slices shown]
[im 16/76  bone]
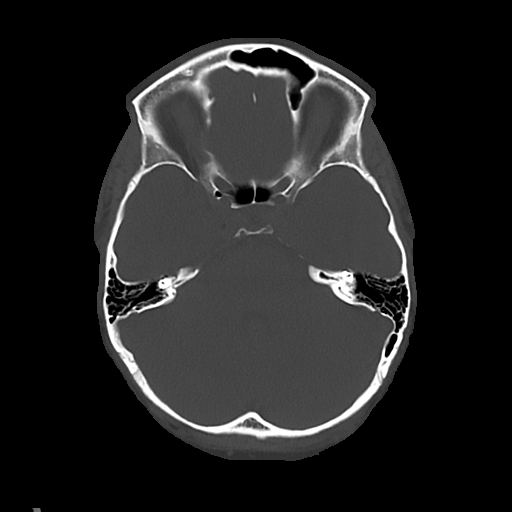
[im 31/76  bone]
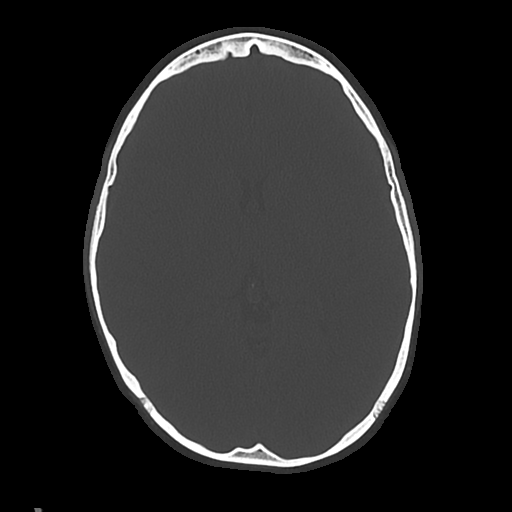
[im 46/76  bone]
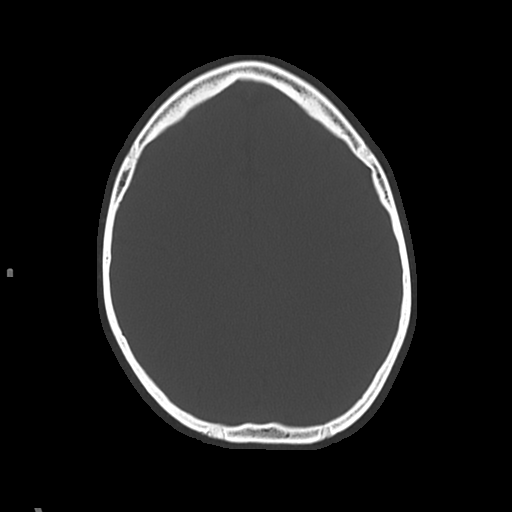
[im 61/76  bone]
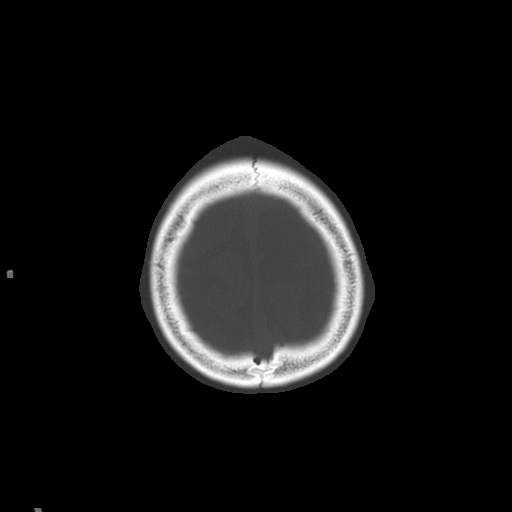

[Series 6: venogram · axial · 0.42mm/px · z∈[-78,+12]mm · 4 of 76 slices shown]
[im 16/76  brain]
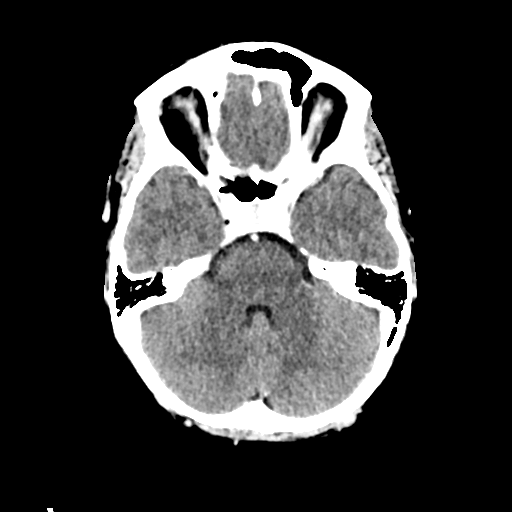
[im 31/76  bone]
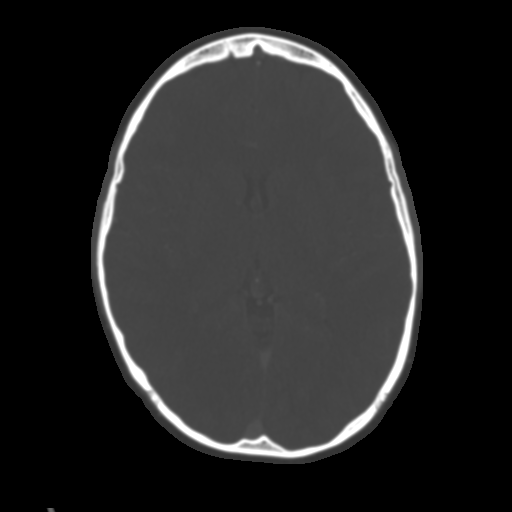
[im 46/76  brain]
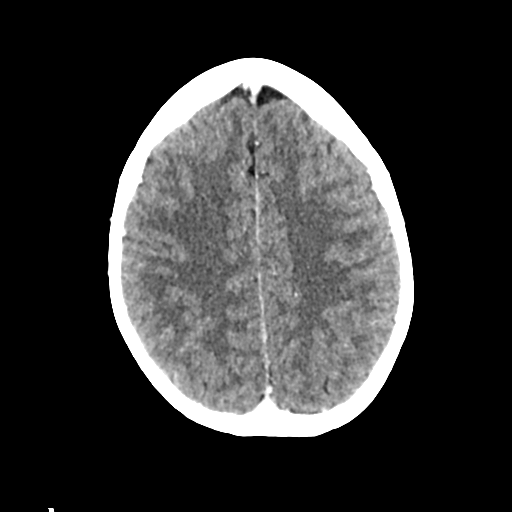
[im 61/76  bone]
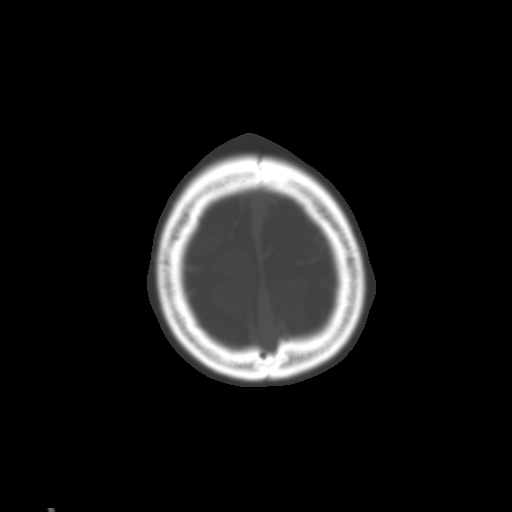

[8 of 47 positions shown; findings below may reference images not displayed]

RADIATION DOSE REDUCTION: This exam was performed according to the
departmental dose-optimization program which includes automated
exposure control, adjustment of the mA and/or kV according to
patient size and/or use of iterative reconstruction technique.

CONTRAST:  75mL OMNIPAQUE IOHEXOL 300 MG/ML  SOLN
FINDINGS: CT HEAD FINDINGS

Brain: There is no mass, hemorrhage or extra-axial collection. The
size and configuration of the ventricles and extra-axial CSF spaces
are normal. The brain parenchyma is normal, without acute or chronic
infarction.

Vascular: No abnormal hyperdensity of the major intracranial
arteries or dural venous sinuses. No intracranial atherosclerosis.

Skull: The visualized skull base, calvarium and extracranial soft
tissues are normal.

Sinuses/Orbits: No fluid levels or advanced mucosal thickening of
the visualized paranasal sinuses. No mastoid or middle ear effusion.
The orbits are normal.

CT VENOGRAM FINDINGS

Superior sagittal sinus: Normal.

Straight sinus: Normal.

Inferior sagittal sinus, vein of ESGEROV and internal cerebral veins:
Normal.

Transverse sinuses: Normal.

Sigmoid sinuses: Normal.

Visualized jugular veins: Normal.
IMPRESSION: Normal CT venogram.

## 2021-04-29 IMAGING — CR DG CHEST 2V
1 series · 2 of 2 positions shown · non-contrast
Comparison: None.

CLINICAL DATA: Fever, chest pain, shortness of breath

EXAM:
CHEST - 2 VIEW

[Series 1: dg chest 2 view · 0.14mm/px · 2 of 2 slices shown]
[im 1/2]
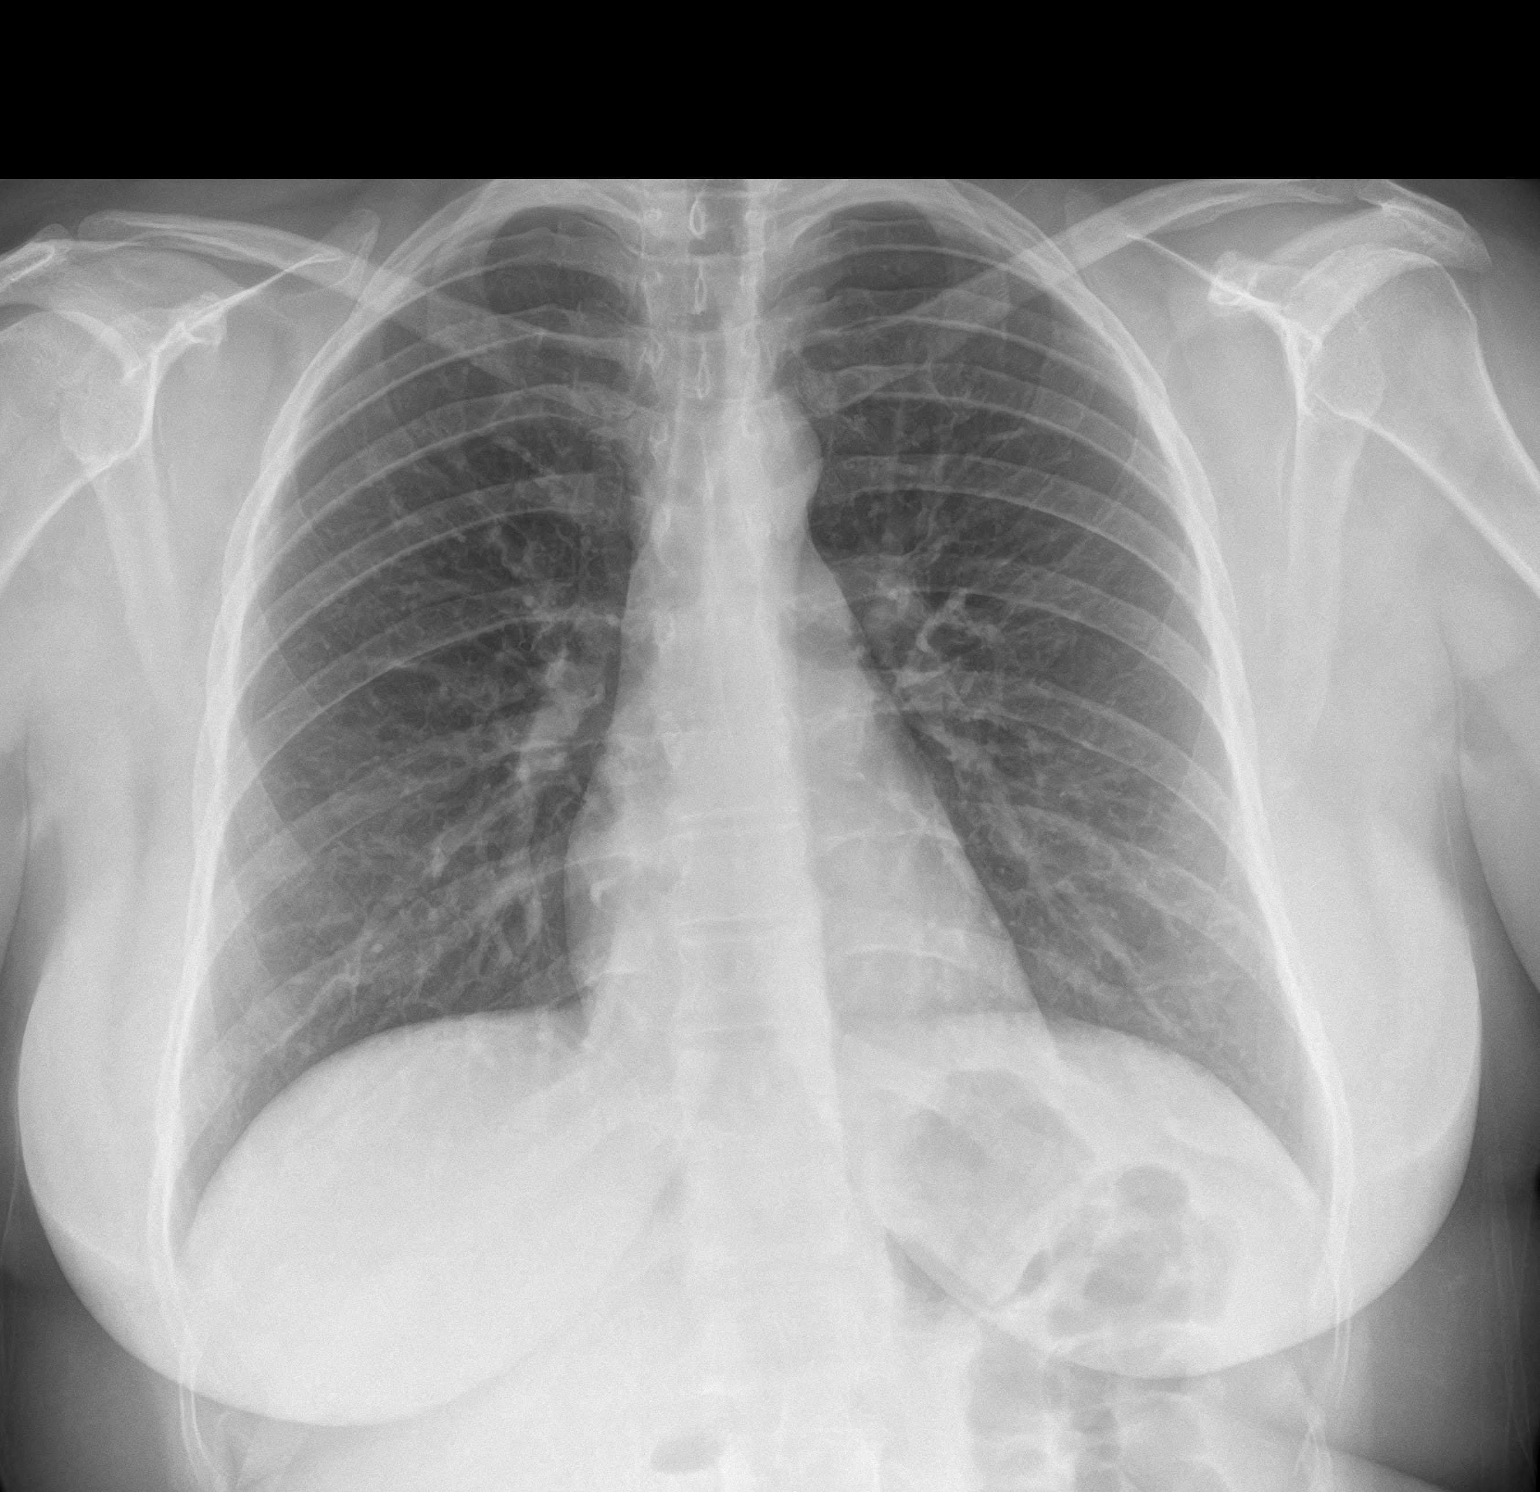
[im 2/2]
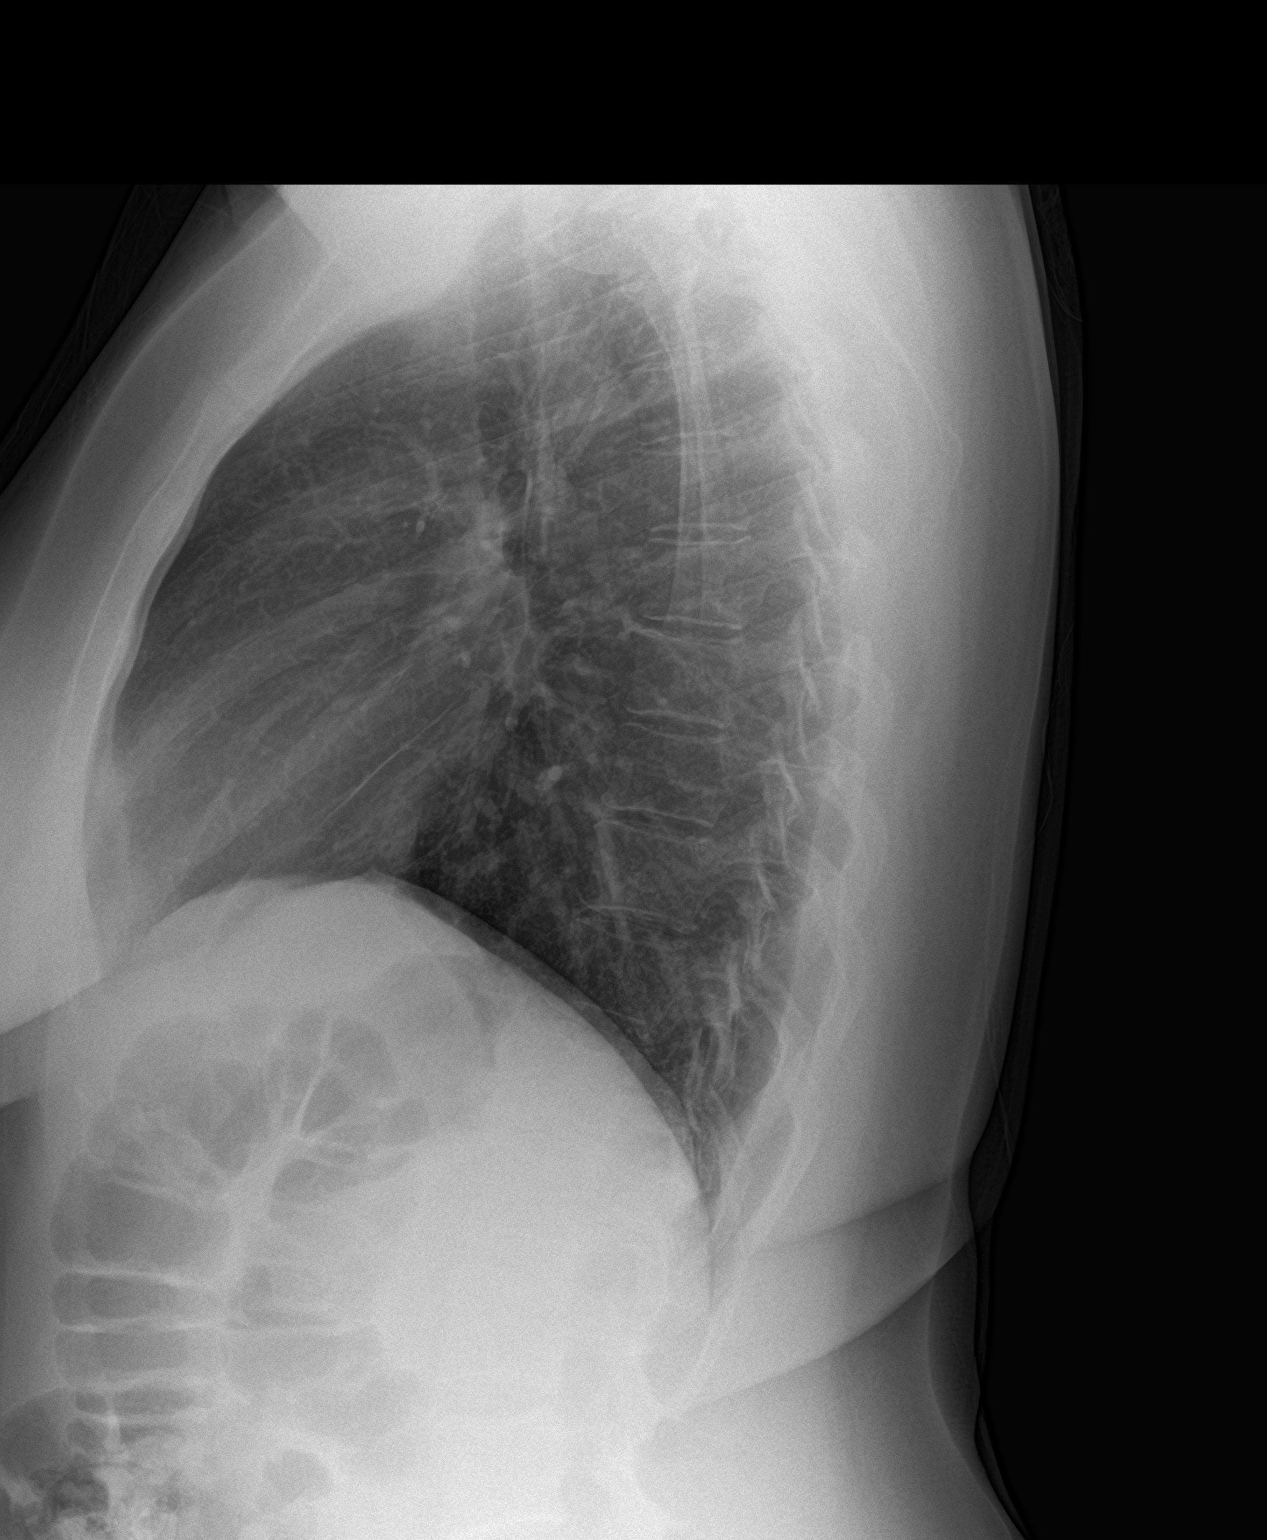

[2 of 2 positions shown; findings below may reference images not displayed]

FINDINGS: The heart size and mediastinal contours are within normal limits.
Both lungs are clear. The visualized skeletal structures are
unremarkable.
IMPRESSION: Normal study.

## 2021-04-29 MED ORDER — METHYLPREDNISOLONE SODIUM SUCC 125 MG IJ SOLR
125.0000 mg | Freq: Once | INTRAMUSCULAR | Status: DC
  Filled 2021-04-29: qty 2

## 2021-04-29 MED ORDER — DIPHENHYDRAMINE HCL 25 MG PO CAPS
50.0000 mg | ORAL_CAPSULE | Freq: Once | ORAL | Status: AC
  Administered 2021-04-29: 50 mg via ORAL
  Filled 2021-04-29: qty 2

## 2021-04-29 MED ORDER — DEXAMETHASONE SODIUM PHOSPHATE 10 MG/ML IJ SOLN
10.0000 mg | Freq: Once | INTRAMUSCULAR | Status: AC
Start: 2021-04-29 — End: 2021-04-29
  Administered 2021-04-29: 10 mg via INTRAVENOUS
  Filled 2021-04-29: qty 1

## 2021-04-29 MED ORDER — FAMOTIDINE 20 MG PO TABS: 20.0000 mg | ORAL_TABLET | Freq: Two times a day (BID) | ORAL | 0 refills | Status: DC

## 2021-04-29 MED ORDER — LIDOCAINE VISCOUS HCL 2 % MT SOLN
15.0000 mL | Freq: Once | OROMUCOSAL | Status: AC
  Administered 2021-04-29: 15 mL via ORAL
  Filled 2021-04-29: qty 15

## 2021-04-29 MED ORDER — ACETAMINOPHEN 500 MG PO TABS
1000.0000 mg | ORAL_TABLET | Freq: Once | ORAL | Status: AC
  Administered 2021-04-29: 1000 mg via ORAL
  Filled 2021-04-29: qty 2

## 2021-04-29 MED ORDER — FAMOTIDINE 20 MG PO TABS
20.0000 mg | ORAL_TABLET | Freq: Once | ORAL | Status: AC
  Administered 2021-04-29: 20 mg via ORAL
  Filled 2021-04-29: qty 1

## 2021-04-29 MED ORDER — DIPHENHYDRAMINE HCL 50 MG PO TABS: 50.0000 mg | ORAL_TABLET | Freq: Three times a day (TID) | ORAL | 0 refills | Status: DC | PRN

## 2021-04-29 MED ORDER — IOHEXOL 300 MG/ML  SOLN
75.0000 mL | Freq: Once | INTRAMUSCULAR | Status: AC | PRN
  Administered 2021-04-29: 75 mL via INTRAVENOUS

## 2021-04-29 MED ORDER — PREDNISONE 10 MG (21) PO TBPK: ORAL_TABLET | ORAL | 0 refills | Status: DC

## 2021-04-29 MED ORDER — KETOROLAC TROMETHAMINE 30 MG/ML IJ SOLN
30.0000 mg | Freq: Once | INTRAMUSCULAR | Status: AC
  Administered 2021-04-29: 30 mg via INTRAVENOUS
  Filled 2021-04-29: qty 1

## 2021-04-29 MED ORDER — ALUM & MAG HYDROXIDE-SIMETH 200-200-20 MG/5ML PO SUSP
30.0000 mL | Freq: Once | ORAL | Status: AC
  Administered 2021-04-29: 30 mL via ORAL
  Filled 2021-04-29: qty 30

## 2021-04-29 NOTE — ED Provider Notes (Addendum)
[facility] Provider Note    Event Date/Time   First MD Initiated Contact with Patient 04/29/21 [phone]     (approximate)   History   Allergic Reaction   HPI  Sophia Hampton is a 31 y.o. 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  Patient states she is concerned she has having an allergic reaction.  States she gave herself an injection of Kesimpta for the first time on Saturday, February 25.  The next day she developed diffuse pruritic rash, sore throat, low-grade fevers, chest pain, shortness of breath.  No cough, vomiting, diarrhea, dysuria.  No sick contacts.  No other new exposures.  She has an appointment to see her neurologist on Friday.  She denies numbness, tingling, weakness, vision changes.  States she is having a headache that is not improving with over-the-counter medications.   History provided by patient and significant other.    Past Medical History:  Diagnosis Date   Anemia    Anxiety    Blood clot of neck vein    Multiple sclerosis (HCC)     Past Surgical History:  Procedure Laterality Date   WISDOM TOOTH EXTRACTION      MEDICATIONS:  Prior to Admission medications   Medication Sig Start Date End Date Taking? Authorizing Provider  APIXABAN (ELIQUIS) VTE STARTER PACK (  AND ) Take as directed on package: start with two-5mg  tablets twice daily for 7 days. On day 8, switch to one-5mg  tablet twice daily. 09/21/20   Tresa Moore, MD  Cholecalciferol (VITAMIN D HIGH POTENCY) 25 MCG (1000 UT) capsule Take 1,000 Units by mouth daily. 4000 iu a day    [provider]  ELIQUIS 5 MG TABS tablet TAKE 1 TABLET BY MOUTH TWICE A DAY 03/17/21   Earna Coder, MD  sertraline (ZOLOFT) 50 MG tablet Take 50 mg by mouth daily. 09/08/20   [provider]    Physical Exam   Triage Vital Signs: ED Triage  Vitals  Enc Vitals Group     BP 04/28/21 2322 127/84     Pulse Rate 04/28/21 2322 95     Resp 04/28/21 2322 16     Temp 04/28/21 2322 100.2 F (37.9 C)     Temp Source 04/28/21 2322 Oral     SpO2 04/28/21 2322 98 %     Weight 04/28/21 2321 171 lb (77.6 kg)     Height 04/28/21 2321  (1.575 m)     Head Circumference --      Peak Flow --      Pain Score 04/28/21 2350 5     Pain Loc --      Pain Edu? --      Excl. in GC? --     Most recent vital signs: Vitals:   04/29/21 0200 04/29/21 0300  BP: 118/78 117/67  Pulse: 83 83  Resp: 19 (!) 23  Temp:    SpO2: 100% 99%    CONSTITUTIONAL: Alert and oriented and responds appropriately to questions. Well-appearing; well-nourished HEAD: Normocephalic, atraumatic EYES: Conjunctivae clear, pupils appear equal, sclera nonicteric ENT: normal nose; moist mucous membranes NECK: Supple, normal ROM; no meningismus; No pharyngeal erythema or petechiae, she does have bilateral tonsillar hypertrophy without exudate, no uvular deviation, no unilateral swelling in posterior oropharynx, no trismus or drooling, no muffled voice, normal phonation, no stridor, airway patent.  No swelling noted to the airway.  CARD: RRR; S1 and S2 appreciated; no murmurs, no clicks, no rubs, no gallops RESP: Normal chest excursion without splinting or tachypnea; breath sounds clear and equal bilaterally; no wheezes, no rhonchi, no rales, no hypoxia or respiratory distress, speaking full sentences ABD/GI: Normal bowel sounds; non-distended; soft, non-tender, no rebound, no guarding, no peritoneal signs BACK: The back appears normal EXT: Normal ROM in all joints; no deformity noted, no edema; no cyanosis SKIN: Normal color for age and race; warm; diffuse erythematous macular papular rash, no urticaria, no blisters or desquamation, no petechia or purpura, no lesions on her palms or mucous membranes, most of the lesions seem to be on the volar wrist NEURO: Moves all  extremities equally, normal speech no facial asymmetry, normal sensation diffusely PSYCH: The patient's mood and manner are appropriate.   ED Results / Procedures / Treatments   LABS: (all labs ordered are listed, but only abnormal results are displayed) Labs Reviewed  CBC WITH DIFFERENTIAL/PLATELET - Abnormal; Notable for the following components:      Result Value   RBC 5.17 (*)    All other components within normal limits  COMPREHENSIVE METABOLIC PANEL - Abnormal; Notable for the following components:   Glucose, Bld 102 (*)    All other components within normal limits  URINALYSIS, ROUTINE W REFLEX MICROSCOPIC - Abnormal; Notable for the following components:   Color, Urine STRAW (*)    APPearance CLEAR (*)    Ketones, ur 5 (*)    All other components within normal limits  RESP PANEL BY RT-PCR (FLU A&B, COVID) ARPGX2  GROUP A STREP BY PCR  MONONUCLEOSIS SCREEN  HCG, QUANTITATIVE, PREGNANCY  TROPONIN I (HIGH SENSITIVITY)     EKG:  EKG Interpretation  Date/Time:  Thursday April 29 2021 01:51:16 EST Ventricular Rate:  83 PR Interval:  144 QRS Duration: 81 QT Interval:  350 QTC Calculation: 412 R Axis:   19 Text Interpretation: Sinus rhythm Confirmed by Rochele Raring [phone]) on 04/29/2021 2:09:03 AM         RADIOLOGY: My personal review and interpretation of imaging: Chest x-ray clear.  I have personally reviewed all radiology reports.   DG Chest 2 View  Result Date: 04/29/2021 CLINICAL DATA:  Fever, chest pain, shortness of breath EXAM: CHEST - 2 VIEW COMPARISON:  None. FINDINGS: The heart size and mediastinal contours are within normal limits. Both lungs are clear. The visualized skeletal structures are unremarkable. IMPRESSION: Normal study. Electronically Signed   By: Charlett Nose M.D.   On: 04/29/2021 01:27     PROCEDURES:  Critical Care performed: No   CRITICAL CARE Performed by: Baxter Hire Gracieann Stannard   Total critical care time: 0 minutes  Critical care time  was exclusive of separately billable procedures and treating other patients.  Critical care was necessary to treat or prevent imminent or life-threatening deterioration.  Critical care was time spent personally by me on the following activities: development of treatment plan with patient and/or surrogate as well as nursing, discussions with consultants, evaluation of patient's response to treatment, examination of patient, obtaining history from patient or surrogate, ordering and performing treatments and interventions, ordering and review of laboratory studies, ordering and review of radiographic studies, pulse oximetry and re-evaluation of patient's condition.   Procedures    IMPRESSION / MDM / ASSESSMENT AND PLAN / ED COURSE  I reviewed the triage vital signs and the nursing notes.    Patient here with concerns for reaction after her first dose of Kesimpta nightly having fevers,  sore throat, headache, chest pain and shortness of breath.  The patient is on the cardiac monitor to evaluate for evidence of arrhythmia and/or significant heart rate changes.   DIFFERENTIAL DIAGNOSIS (includes but not limited to):   Allergic reaction, medication side effects, viral illness, pneumonia, UTI, cavernous sinus thrombosis, meningitis, intracranial hemorrhage, viral pharyngitis, strep pharyngitis, tonsillitis, mononucleosis, no signs of uvulitis, PTA or deep space neck infection, less likely ACS, PE, dissection.  No signs of anaphylaxis today.   PLAN: We will obtain CBC, CMP, troponin, chest x-ray, urine, EKG.  She has already received Benadryl in triage.  Will give Decadron in case there is any component of an allergic reaction however symptoms seem atypical for a true histamine type reaction.  Will give Tylenol, Toradol for symptomatic relief.  Will obtain CT venogram of her head given her history of cavernous sinus thrombosis and having an unrelenting headache today.  Will obtain COVID and flu swabs  as well as check for strep and mononucleosis.  I do not think she is having an MS flare.  She has no neurologic complaints today other than headache.   MEDICATIONS GIVEN IN ED: Medications  alum & mag hydroxide-simeth (MAALOX/MYLANTA) 200-200-20 MG/5ML suspension 30 mL (has no administration in time range)    And  lidocaine (XYLOCAINE) 2 % viscous mouth solution 15 mL (has no administration in time range)  famotidine (PEPCID) tablet 20 mg (has no administration in time range)  diphenhydrAMINE (BENADRYL) capsule 50 mg (50 mg Oral Given 04/29/21 0007)  acetaminophen (TYLENOL) tablet 1,000 mg (1,000 mg Oral Given 04/29/21 0146)  ketorolac (TORADOL) 30 MG/ML injection 30 mg (30 mg Intravenous Given 04/29/21 0146)  dexamethasone (DECADRON) injection 10 mg (10 mg Intravenous Given 04/29/21 0147)  iohexol (OMNIPAQUE) 300 MG/ML solution 75 mL (75 mLs Intravenous Contrast Given 04/29/21 0238)     ED COURSE: Patient's work-up today was very reassuring.  Normal white blood cell count, hemoglobin, electrolytes, renal function, LFTs.  Negative troponin.  Pregnancy test negative.  COVID, flu, mono, strep negative.  Urine shows no sign of infection.  Chest x-ray reviewed by myself and radiologist and shows no infiltrate, edema, pneumothorax.  Her EKG is nonischemic.  She still has a maculopapular rash that does not look like it has improved nor has it worsened.  She has no urticaria on exam.  Again no blisters, desquamation or involvement of her mucous membranes.  Will discharge on a steroid taper and have advised her to continue Benadryl and we will start her on Pepcid as well.  She thinks that her chest pain is due to acid reflux.  Discussed with her that Pepcid can help with this and we will also give GI cocktail here.  Recommended over-the-counter medications if acid reflux symptoms continue.  She has follow-up with her doctor tomorrow.  I have advised her to avoid any further doses of Kesimpta.  I suspect that some of  her symptoms may be due to a medication reaction but she may also have a viral illness that she was at higher risk for due to being on this monoclonal antibody.  No sign of sepsis today.  She is nontoxic in appearance.  No sign of bacterial infection.  I do not feel she needs to be on antibiotics.   At this time, I do not feel there is any life-threatening condition present. I reviewed all nursing notes, vitals, pertinent previous records.  All lab and urine results, EKGs, imaging ordered have been independently reviewed and interpreted  by myself.  I reviewed all available radiology reports from any imaging ordered this visit.  Based on my assessment, I feel the patient is safe to be discharged home without further emergent workup and can continue workup as an outpatient as needed. Discussed all findings, treatment plan as well as usual and customary return precautions with patient and significant other.  They verbalize understanding and are comfortable with this plan.  Outpatient follow-up has been provided as needed.  All questions have been answered.    CONSULTS: No admission needed at this time given patient has had a very reassuring work-up here and remains hemodynamically stable without any signs of anaphylaxis.   OUTSIDE RECORDS REVIEWED: Reviewed patient's last hematology/oncology note with Dr. Donneta Romberg on 03/22/2021 and neurology note with Yolande Jolly PA on 02/12/2021.         FINAL CLINICAL IMPRESSION(S) / ED DIAGNOSES   Final diagnoses:  Medication reaction, initial encounter  Atypical chest pain  Rash  Fever, unspecified fever cause  Pharyngitis, unspecified etiology     Rx / DC Orders   ED Discharge Orders          Ordered    famotidine (PEPCID) 20 MG tablet  2 times daily        04/29/21 0338    predniSONE (STERAPRED UNI-PAK 21 TAB) 10 MG (21) TBPK tablet        04/29/21 4854             Note:  This document was prepared using Dragon voice recognition  software and may include unintentional dictation errors.   Jediah Horger, Layla Maw, DO 04/29/21 0343    Rainer Mounce, Layla Maw, DO 04/29/21 [phone]

## 2021-04-29 NOTE — Discharge Instructions (Addendum)
You were seen in the emergency department for concerns for an allergic versus adverse reaction to Kesimpta.  Some of your symptoms may be due to reaction from this medication however some of your symptoms were atypical and could be caused by other etiologies such as a viral illness (which Kesimpta can make you at higher risk for).  Your work-up today has been very reassuring with normal blood counts, renal function, electrolytes, cardiac labs, EKG.  Chest x-ray and urine showed no sign of infection.  Your COVID, flu, strep and mono tests were normal today.  There are no signs of any bacterial infection that would require antibiotics today.  I recommend that you continue over-the-counter Benadryl for rash and itching and we are discharging you with Pepcid and steroids that also will help with allergic type symptoms.  The Pepcid may also help with your acid reflux symptoms but you may also use over-the-counter Tums, Mylanta or Maalox as needed.  If you develop worsening rash, lip or tongue swelling, difficulty breathing, tightness in your chest, vomiting and cannot stop, please return to the emergency department.  Please follow-up with your doctor as scheduled on Friday. ? ? ?You may alternate Tylenol 1000 mg every 6 hours as needed for pain, fever and Ibuprofen 800 mg every 6-8 hours as needed for pain, fever.  Please take Ibuprofen with food.  Do not take more than 4000 mg of Tylenol (acetaminophen) in a 24 hour period. ? ?Please avoid spicy, acidic (citrus fruits, tomato based sauces, salsa), greasy, fatty foods.  Please avoid caffeine and alcohol.  Smoking can also make GERD/acid reflux worse.  Over the counter medications such as TUMS, Maalox or Mylanta, pepcid, Prilosec or Nexium may help with your symptoms.  Do not take Prilosec or Nexium if you are already prescribed a proton pump inhibitor. ? ?

## 2021-04-30 DIAGNOSIS — G35 Multiple sclerosis: Secondary | ICD-10-CM | POA: Diagnosis not present

## 2021-04-30 DIAGNOSIS — Z6831 Body mass index (BMI) 31.0-31.9, adult: Secondary | ICD-10-CM | POA: Diagnosis not present

## 2021-05-05 DIAGNOSIS — F411 Generalized anxiety disorder: Secondary | ICD-10-CM | POA: Diagnosis not present

## 2021-05-18 DIAGNOSIS — R519 Headache, unspecified: Secondary | ICD-10-CM | POA: Diagnosis not present

## 2021-05-18 DIAGNOSIS — E559 Vitamin D deficiency, unspecified: Secondary | ICD-10-CM | POA: Diagnosis not present

## 2021-05-18 DIAGNOSIS — N926 Irregular menstruation, unspecified: Secondary | ICD-10-CM | POA: Diagnosis not present

## 2021-05-18 DIAGNOSIS — R5383 Other fatigue: Secondary | ICD-10-CM | POA: Diagnosis not present

## 2021-05-19 DIAGNOSIS — F411 Generalized anxiety disorder: Secondary | ICD-10-CM | POA: Diagnosis not present

## 2021-05-19 DIAGNOSIS — G35 Multiple sclerosis: Secondary | ICD-10-CM | POA: Diagnosis not present

## 2021-05-19 DIAGNOSIS — N926 Irregular menstruation, unspecified: Secondary | ICD-10-CM | POA: Diagnosis not present

## 2021-05-19 DIAGNOSIS — E559 Vitamin D deficiency, unspecified: Secondary | ICD-10-CM | POA: Diagnosis not present

## 2021-05-19 DIAGNOSIS — R5383 Other fatigue: Secondary | ICD-10-CM | POA: Diagnosis not present

## 2021-05-19 DIAGNOSIS — R700 Elevated erythrocyte sedimentation rate: Secondary | ICD-10-CM | POA: Diagnosis not present

## 2021-05-19 DIAGNOSIS — R519 Headache, unspecified: Secondary | ICD-10-CM | POA: Diagnosis not present

## 2021-05-26 DIAGNOSIS — F411 Generalized anxiety disorder: Secondary | ICD-10-CM | POA: Diagnosis not present

## 2021-06-02 DIAGNOSIS — F411 Generalized anxiety disorder: Secondary | ICD-10-CM | POA: Diagnosis not present

## 2021-06-16 ENCOUNTER — Other Ambulatory Visit: Payer: Self-pay | Admitting: Internal Medicine

## 2021-06-16 DIAGNOSIS — F411 Generalized anxiety disorder: Secondary | ICD-10-CM | POA: Diagnosis not present

## 2021-06-17 DIAGNOSIS — Z6831 Body mass index (BMI) 31.0-31.9, adult: Secondary | ICD-10-CM | POA: Diagnosis not present

## 2021-06-17 DIAGNOSIS — G35 Multiple sclerosis: Secondary | ICD-10-CM | POA: Diagnosis not present

## 2021-06-17 DIAGNOSIS — H469 Unspecified optic neuritis: Secondary | ICD-10-CM | POA: Diagnosis not present

## 2021-06-23 DIAGNOSIS — F411 Generalized anxiety disorder: Secondary | ICD-10-CM | POA: Diagnosis not present

## 2021-06-30 DIAGNOSIS — F411 Generalized anxiety disorder: Secondary | ICD-10-CM | POA: Diagnosis not present

## 2021-07-07 DIAGNOSIS — M7989 Other specified soft tissue disorders: Secondary | ICD-10-CM | POA: Diagnosis not present

## 2021-07-09 DIAGNOSIS — M7989 Other specified soft tissue disorders: Secondary | ICD-10-CM | POA: Diagnosis not present

## 2021-07-14 DIAGNOSIS — F411 Generalized anxiety disorder: Secondary | ICD-10-CM | POA: Diagnosis not present

## 2021-07-14 DIAGNOSIS — G35 Multiple sclerosis: Secondary | ICD-10-CM | POA: Diagnosis not present

## 2021-07-21 DIAGNOSIS — F411 Generalized anxiety disorder: Secondary | ICD-10-CM | POA: Diagnosis not present

## 2021-07-28 DIAGNOSIS — F411 Generalized anxiety disorder: Secondary | ICD-10-CM | POA: Diagnosis not present

## 2021-07-28 DIAGNOSIS — G35 Multiple sclerosis: Secondary | ICD-10-CM | POA: Diagnosis not present

## 2021-07-30 DIAGNOSIS — F411 Generalized anxiety disorder: Secondary | ICD-10-CM | POA: Diagnosis not present

## 2021-08-11 DIAGNOSIS — G35 Multiple sclerosis: Secondary | ICD-10-CM | POA: Diagnosis not present

## 2021-08-11 DIAGNOSIS — F411 Generalized anxiety disorder: Secondary | ICD-10-CM | POA: Diagnosis not present

## 2021-08-18 DIAGNOSIS — Z01419 Encounter for gynecological examination (general) (routine) without abnormal findings: Secondary | ICD-10-CM | POA: Diagnosis not present

## 2021-08-18 DIAGNOSIS — Z6829 Body mass index (BMI) 29.0-29.9, adult: Secondary | ICD-10-CM | POA: Diagnosis not present

## 2021-08-18 DIAGNOSIS — F411 Generalized anxiety disorder: Secondary | ICD-10-CM | POA: Diagnosis not present

## 2021-08-18 DIAGNOSIS — N926 Irregular menstruation, unspecified: Secondary | ICD-10-CM | POA: Diagnosis not present

## 2021-08-19 DIAGNOSIS — H5319 Other subjective visual disturbances: Secondary | ICD-10-CM | POA: Diagnosis not present

## 2021-08-19 DIAGNOSIS — G35 Multiple sclerosis: Secondary | ICD-10-CM | POA: Diagnosis not present

## 2021-08-25 DIAGNOSIS — F411 Generalized anxiety disorder: Secondary | ICD-10-CM | POA: Diagnosis not present

## 2021-09-08 DIAGNOSIS — F411 Generalized anxiety disorder: Secondary | ICD-10-CM | POA: Diagnosis not present

## 2021-09-15 DIAGNOSIS — F411 Generalized anxiety disorder: Secondary | ICD-10-CM | POA: Diagnosis not present

## 2021-09-17 DIAGNOSIS — R5383 Other fatigue: Secondary | ICD-10-CM | POA: Diagnosis not present

## 2021-09-17 DIAGNOSIS — E559 Vitamin D deficiency, unspecified: Secondary | ICD-10-CM | POA: Diagnosis not present

## 2021-09-17 DIAGNOSIS — G35 Multiple sclerosis: Secondary | ICD-10-CM | POA: Diagnosis not present

## 2021-09-17 DIAGNOSIS — E782 Mixed hyperlipidemia: Secondary | ICD-10-CM | POA: Diagnosis not present

## 2021-09-17 DIAGNOSIS — Z0000 Encounter for general adult medical examination without abnormal findings: Secondary | ICD-10-CM | POA: Diagnosis not present

## 2021-09-22 DIAGNOSIS — F411 Generalized anxiety disorder: Secondary | ICD-10-CM | POA: Diagnosis not present

## 2021-09-23 DIAGNOSIS — L089 Local infection of the skin and subcutaneous tissue, unspecified: Secondary | ICD-10-CM | POA: Diagnosis not present

## 2021-09-24 DIAGNOSIS — G35 Multiple sclerosis: Secondary | ICD-10-CM | POA: Diagnosis not present

## 2021-09-24 DIAGNOSIS — R519 Headache, unspecified: Secondary | ICD-10-CM | POA: Diagnosis not present

## 2021-09-24 DIAGNOSIS — Z79899 Other long term (current) drug therapy: Secondary | ICD-10-CM | POA: Diagnosis not present

## 2021-10-06 DIAGNOSIS — F411 Generalized anxiety disorder: Secondary | ICD-10-CM | POA: Diagnosis not present

## 2021-10-13 DIAGNOSIS — F411 Generalized anxiety disorder: Secondary | ICD-10-CM | POA: Diagnosis not present

## 2021-10-20 DIAGNOSIS — F411 Generalized anxiety disorder: Secondary | ICD-10-CM | POA: Diagnosis not present

## 2021-10-27 DIAGNOSIS — F411 Generalized anxiety disorder: Secondary | ICD-10-CM | POA: Diagnosis not present

## 2021-11-03 DIAGNOSIS — F411 Generalized anxiety disorder: Secondary | ICD-10-CM | POA: Diagnosis not present

## 2021-11-10 DIAGNOSIS — F411 Generalized anxiety disorder: Secondary | ICD-10-CM | POA: Diagnosis not present

## 2021-11-17 DIAGNOSIS — F411 Generalized anxiety disorder: Secondary | ICD-10-CM | POA: Diagnosis not present

## 2021-12-01 DIAGNOSIS — F411 Generalized anxiety disorder: Secondary | ICD-10-CM | POA: Diagnosis not present

## 2021-12-08 DIAGNOSIS — F411 Generalized anxiety disorder: Secondary | ICD-10-CM | POA: Diagnosis not present

## 2021-12-15 DIAGNOSIS — F411 Generalized anxiety disorder: Secondary | ICD-10-CM | POA: Diagnosis not present

## 2021-12-27 DIAGNOSIS — Z79899 Other long term (current) drug therapy: Secondary | ICD-10-CM | POA: Diagnosis not present

## 2021-12-27 DIAGNOSIS — G35 Multiple sclerosis: Secondary | ICD-10-CM | POA: Diagnosis not present

## 2021-12-27 DIAGNOSIS — R519 Headache, unspecified: Secondary | ICD-10-CM | POA: Diagnosis not present

## 2021-12-27 DIAGNOSIS — M4802 Spinal stenosis, cervical region: Secondary | ICD-10-CM | POA: Diagnosis not present

## 2021-12-29 DIAGNOSIS — F411 Generalized anxiety disorder: Secondary | ICD-10-CM | POA: Diagnosis not present

## 2022-01-05 DIAGNOSIS — F411 Generalized anxiety disorder: Secondary | ICD-10-CM | POA: Diagnosis not present

## 2022-01-12 DIAGNOSIS — F411 Generalized anxiety disorder: Secondary | ICD-10-CM | POA: Diagnosis not present

## 2022-01-17 DIAGNOSIS — G35 Multiple sclerosis: Secondary | ICD-10-CM | POA: Diagnosis not present

## 2022-01-17 DIAGNOSIS — R519 Headache, unspecified: Secondary | ICD-10-CM | POA: Diagnosis not present

## 2022-01-26 DIAGNOSIS — F411 Generalized anxiety disorder: Secondary | ICD-10-CM | POA: Diagnosis not present

## 2022-02-01 DIAGNOSIS — N926 Irregular menstruation, unspecified: Secondary | ICD-10-CM | POA: Diagnosis not present

## 2022-02-01 DIAGNOSIS — R7982 Elevated C-reactive protein (CRP): Secondary | ICD-10-CM | POA: Diagnosis not present

## 2022-02-01 DIAGNOSIS — R5383 Other fatigue: Secondary | ICD-10-CM | POA: Diagnosis not present

## 2022-02-01 DIAGNOSIS — G43111 Migraine with aura, intractable, with status migrainosus: Secondary | ICD-10-CM | POA: Diagnosis not present

## 2022-02-01 DIAGNOSIS — E559 Vitamin D deficiency, unspecified: Secondary | ICD-10-CM | POA: Diagnosis not present

## 2022-02-01 DIAGNOSIS — G35 Multiple sclerosis: Secondary | ICD-10-CM | POA: Diagnosis not present

## 2022-02-02 DIAGNOSIS — F411 Generalized anxiety disorder: Secondary | ICD-10-CM | POA: Diagnosis not present

## 2022-02-08 DIAGNOSIS — F411 Generalized anxiety disorder: Secondary | ICD-10-CM | POA: Diagnosis not present

## 2022-02-09 DIAGNOSIS — F411 Generalized anxiety disorder: Secondary | ICD-10-CM | POA: Diagnosis not present

## 2022-02-12 DIAGNOSIS — G35 Multiple sclerosis: Secondary | ICD-10-CM | POA: Diagnosis not present

## 2022-02-18 DIAGNOSIS — F411 Generalized anxiety disorder: Secondary | ICD-10-CM | POA: Diagnosis not present

## 2022-05-02 DIAGNOSIS — Z5321 Procedure and treatment not carried out due to patient leaving prior to being seen by health care provider: Secondary | ICD-10-CM | POA: Diagnosis not present

## 2022-05-02 DIAGNOSIS — E162 Hypoglycemia, unspecified: Secondary | ICD-10-CM | POA: Insufficient documentation

## 2022-05-03 ENCOUNTER — Encounter: Payer: Self-pay | Admitting: Emergency Medicine

## 2022-05-03 ENCOUNTER — Emergency Department
Admission: EM | Admit: 2022-05-03 | Discharge: 2022-05-03 | Discharge status: Against Medical Advice | Payer: No Typology Code available for payment source | Attending: Emergency Medicine | Admitting: Emergency Medicine

## 2022-05-03 LAB — COMPREHENSIVE METABOLIC PANEL
ALT: 23 U/L (ref 0–44)
AST: 18 U/L (ref 15–41)
Albumin: 3.9 g/dL (ref 3.5–5.0)
Alkaline Phosphatase: 54 U/L (ref 38–126)
Anion gap: 8 (ref 5–15)
BUN: 16 mg/dL (ref 6–20)
CO2: 25 mmol/L (ref 22–32)
Calcium: 9.6 mg/dL (ref 8.9–10.3)
Chloride: 101 mmol/L (ref 98–111)
Creatinine, Ser: 0.62 mg/dL (ref 0.44–1.00)
GFR, Estimated: >60 mL/min (ref >60)
Glucose, Bld: 100 mg/dL — ABNORMAL HIGH (ref 70–99)
Potassium: 3.8 mmol/L (ref 3.5–5.1)
Sodium: 134 mmol/L — ABNORMAL LOW (ref 135–145)
Total Bilirubin: 0.5 mg/dL (ref 0.3–1.2)
Total Protein: 7 g/dL (ref 6.5–8.1)

## 2022-05-03 LAB — CBC WITH DIFFERENTIAL/PLATELET
Abs Immature Granulocytes: 0.03 10*3/uL (ref 0.00–0.07)
Basophils Absolute: 0.1 10*3/uL (ref 0.0–0.1)
Basophils Relative: 1 %
Eosinophils Absolute: 0.2 10*3/uL (ref 0.0–0.5)
Eosinophils Relative: 2 %
HCT: 38.7 % (ref 36.0–46.0)
Hemoglobin: 12.6 g/dL (ref 12.0–15.0)
Immature Granulocytes: 0 %
Lymphocytes Relative: 28 %
Lymphs Abs: 2.6 10*3/uL (ref 0.7–4.0)
MCH: 27.3 pg (ref 26.0–34.0)
MCHC: 32.6 g/dL (ref 30.0–36.0)
MCV: 83.8 fL (ref 80.0–100.0)
Monocytes Absolute: 0.8 10*3/uL (ref 0.1–1.0)
Monocytes Relative: 8 %
Neutro Abs: 5.9 10*3/uL (ref 1.7–7.7)
Neutrophils Relative %: 61 %
Platelets: 297 10*3/uL (ref 150–400)
RBC: 4.62 MIL/uL (ref 3.87–5.11)
RDW: 12.8 % (ref 11.5–15.5)
WBC: 9.5 10*3/uL (ref 4.0–10.5)
nRBC: 0 % (ref 0.0–0.2)

## 2022-05-03 LAB — URINALYSIS, ROUTINE W REFLEX MICROSCOPIC
Bilirubin Urine: NEGATIVE
Glucose, UA: NEGATIVE mg/dL
Ketones, ur: NEGATIVE mg/dL
Leukocytes,Ua: NEGATIVE
Nitrite: NEGATIVE
Protein, ur: NEGATIVE mg/dL
Specific Gravity, Urine: 1.006 (ref 1.005–1.030)
Squamous Epithelial / HPF: NONE SEEN /HPF (ref 0–5)
pH: 7 (ref 5.0–8.0)

## 2022-05-03 LAB — POC URINE PREG, ED: Preg Test, Ur: NEGATIVE

## 2022-05-03 LAB — CBG MONITORING, ED: Glucose-Capillary: 86 mg/dL (ref 70–99)

## 2022-05-03 NOTE — ED Triage Notes (Addendum)
Pt presents ambulatory to triage via POV with complaints of hypoglycemia she notes her neurologist (Hx of MS) had some labs today and was told that she had a glucose level of 44. Pt denies any dizziness, fatigue, vision changes. A&Ox4 at this time. Denies CP or SOB.

## 2022-08-05 NOTE — Telephone Encounter (Signed)
x

## 2022-09-19 NOTE — Progress Notes (Unsigned)
09/20/2022 Sophia Hampton [id number] 05-05-1990  Referring provider: Soundra Pilon, FNP Primary GI doctor: Dr. Barron Alvine  (Dr. Orvan Falconer)  ASSESSMENT AND PLAN:   Alternating constipation and diarrhea, intermittent urgency, lower AB pain, better with BM Check celiac, check TSH, check Sed rate/CRP Can do trial of IBGARD daily, will give Levsin as needed in the mean time  Add on citracel/benefiber FODMAP,  and lifestyle changes discussed Pending labs and response to medication, consider schedule endoscopic evaluation but at this time appears to be IBS Consider SIBO testing or xifaxin trial pending results  Epigastric pain with GERD Normal CT 2016 Lifestyle changes discussed, avoid NSAIDS, ETOH Start on prilosec 20 mg x 6-8 weeks Check H. Pylori stool Check Ab [place]   Patient Care Team: Soundra Pilon, FNP as PCP - General (Family Medicine)  HISTORY OF PRESENT ILLNESS: 32 y.o. female with a past medical history of IBS, elevated liver enzymes, MS, cerebral venous thrombosis requiring Eliquis for 6 months in 2022-2023, anxiety, reflux, anemia and others listed below presents for evaluation of alternating diarrhea/constipation and GERD.   12/10/2020 last saw Dr. Orvan Falconer in the office for elevated ALT July 2022.  And change in bowel habits. LFT normal since that time.  2016 CT abdomen pelvis with contrast unremarkable. Patient has family history of alpha-1 antitrypsin deficiency, this was negative.  Patient was reassured due to normal liver function at that time, She follows with [facility], Dr. Sherryll Burger for her MS, just had OV 07/08/20224, she is on Ocrevus every 6 months.   She has alternating diarrhea/constipation, she will have formed stools but can have diarrhea/constipation alternating.  She states she can have urgency with AB cramping, better afterwards.  No melena, hematochezia.  She has had GERD with nausea after eating, no vomiting. States fatty foods can make it worse. Has not  tried anything to help.  She gets full very quickly and burning sensation upper/epigastric AB that can be constant for days. Has not tried anything.  She take ibuprofen occ for headache, once every two weeks but can be a few times a week, 600-800 mg dose. Denies ETOH. No smoking.  She will vape marijuana, 1-2 x a week.     She  reports that she has quit smoking. She has never used smokeless tobacco. She reports that she does not currently use alcohol. She reports that she does not currently use drugs after having used the following drugs: Marijuana.  RELEVANT LABS AND IMAGING: CBC    Component Value Date/Time   WBC 9.5 05/03/2022 0013   RBC 4.62 05/03/2022 0013   HGB 12.6 05/03/2022 0013   HCT 38.7 05/03/2022 0013   PLT 297 05/03/2022 0013   MCV 83.8 05/03/2022 0013   MCV 81.8 05/22/2011 1825   MCH 27.3 05/03/2022 0013   MCHC 32.6 05/03/2022 0013   RDW 12.8 05/03/2022 0013   LYMPHSABS 2.6 05/03/2022 0013   MONOABS 0.8 05/03/2022 0013   EOSABS 0.2 05/03/2022 0013   BASOSABS 0.1 05/03/2022 0013   Recent Labs    05/03/22 0013  HGB 12.6    CMP     Component Value Date/Time   NA 134 (L) 05/03/2022 0013   K 3.8 05/03/2022 0013   CL 101 05/03/2022 0013   CO2 25 05/03/2022 0013   GLUCOSE 100 (H) 05/03/2022 0013   BUN 16 05/03/2022 0013   CREATININE 0.62 05/03/2022 0013   CALCIUM 9.6 05/03/2022 0013   PROT 7.0 05/03/2022 0013   ALBUMIN 3.9 05/03/2022  0013   AST 18 05/03/2022 0013   ALT 23 05/03/2022 0013   ALKPHOS 54 05/03/2022 0013   BILITOT 0.5 05/03/2022 0013   GFRNONAA >60 05/03/2022 0013   GFRAA >60 02/02/2015 1354      Latest Ref Rng & Units 05/03/2022   12:13 AM 04/29/2021    1:28 AM 03/22/2021    2:26 PM  Hepatic Function  Total Protein 6.5 - 8.1 g/dL 7.0  7.5  7.7   Albumin 3.5 - 5.0 g/dL 3.9  4.0  4.4   AST 15 - 41 U/L 18  28  20    ALT 0 - 44 U/L 23  35  29   Alk Phosphatase 38 - 126 U/L 54  59  62   Total Bilirubin 0.3 - 1.2 mg/dL 0.5  0.4  0.3        Current Medications:        Current Outpatient Medications (Other):    Cholecalciferol (VITAMIN D HIGH POTENCY) 25 MCG (1000 UT) capsule, Take 1,000 Units by mouth daily. 4000 iu a day   hyoscyamine (LEVSIN SL) 0.125 MG SL tablet, Place 1 tablet (0.125 mg total) under the tongue every 6 (six) hours as needed for cramping (nausea, diarrhea).   omeprazole (PRILOSEC) 20 MG capsule, Take 1 capsule (20 mg total) by mouth daily.  Medical History:  Past Medical History:  Diagnosis Date   Anemia    Anxiety    Blood clot of neck vein    Multiple sclerosis (HCC)    Allergies:  Allergies  Allergen Reactions   Ofatumumab Hives, Itching, Other (See Comments), Rash and Shortness Of Breath    Rash, itching and fever, joint pain related to Kesimpta.     Surgical History:  She  has a past surgical history that includes Wisdom tooth extraction. Family History:  Her family history includes Alpha-1 antitrypsin deficiency in her mother; COPD in her mother; Cirrhosis in her mother; Diabetes in her maternal aunt; Heart attack in her father; Hyperlipidemia in her father; Prostate cancer in her maternal grandfather; Stroke in her paternal grandfather.  REVIEW OF SYSTEMS  : All other systems reviewed and negative except where noted in the History of Present Illness.  PHYSICAL EXAM: BP 116/68 (BP Location: Left Arm, Patient Position: Sitting, Cuff Size: Normal)   Pulse 76   Ht 5\' 2"  (1.575 m)   Wt 173 lb (78.5 kg)   SpO2 99%   BMI 31.64 kg/m  General Appearance: Well nourished, in no apparent distress. Head:   Normocephalic and atraumatic. Eyes:  sclerae anicteric,conjunctive pink  Respiratory: Respiratory effort normal, BS equal bilaterally without rales, rhonchi, wheezing. Cardio: RRR with no MRGs. Peripheral pulses intact.  Abdomen: Soft,  Obese ,active bowel sounds. mild tenderness in the epigastrium, in the RUQ, and in the RLQ. Without guarding and Without rebound. No masses. Rectal:  Not evaluated Musculoskeletal: Full ROM, Normal gait. Without edema. Skin:  Dry and intact without significant lesions or rashes Neuro: Alert and  oriented x4;  No focal deficits. Psych:  Cooperative. Normal mood and affect.    Doree Albee, PA-C 3:35 PM

## 2022-09-20 ENCOUNTER — Other Ambulatory Visit (INDEPENDENT_AMBULATORY_CARE_PROVIDER_SITE_OTHER): Payer: No Typology Code available for payment source

## 2022-09-20 ENCOUNTER — Other Ambulatory Visit: Payer: Self-pay

## 2022-09-20 ENCOUNTER — Encounter: Payer: Self-pay | Admitting: Physician Assistant

## 2022-09-20 ENCOUNTER — Ambulatory Visit (INDEPENDENT_AMBULATORY_CARE_PROVIDER_SITE_OTHER): Payer: No Typology Code available for payment source | Admitting: Physician Assistant

## 2022-09-20 VITALS — BP 116/68 | HR 76 | Ht 62.0 in | Wt 173.0 lb

## 2022-09-20 DIAGNOSIS — R198 Other specified symptoms and signs involving the digestive system and abdomen: Secondary | ICD-10-CM

## 2022-09-20 DIAGNOSIS — R1013 Epigastric pain: Secondary | ICD-10-CM | POA: Diagnosis not present

## 2022-09-20 DIAGNOSIS — R1030 Lower abdominal pain, unspecified: Secondary | ICD-10-CM

## 2022-09-20 LAB — SEDIMENTATION RATE: Sed Rate: 29 mm/hr — ABNORMAL HIGH (ref 0–20)

## 2022-09-20 LAB — HIGH SENSITIVITY CRP: CRP, High Sensitivity: 5.76 mg/L — ABNORMAL HIGH (ref 0.000–5.000)

## 2022-09-20 LAB — TSH: TSH: 0.66 u[IU]/mL (ref 0.35–5.50)

## 2022-09-20 MED ORDER — HYOSCYAMINE SULFATE 0.125 MG SL SUBL: 0.1250 mg | SUBLINGUAL_TABLET | Freq: Four times a day (QID) | SUBLINGUAL | 1 refills | Status: AC | PRN

## 2022-09-20 MED ORDER — OMEPRAZOLE 20 MG PO CPDR: 20.0000 mg | DELAYED_RELEASE_CAPSULE | Freq: Every day | ORAL | 0 refills | Status: DC

## 2022-09-20 NOTE — Patient Instructions (Addendum)
You have been scheduled for an abdominal ultrasound at [facility] Radiology (1st floor of hospital) on 09/27/2022 at 10:00am. Please arrive 30 minutes prior to your appointment for registration. Make certain not to have anything to eat or drink 6 hours prior to your appointment. Should you need to reschedule your appointment, please contact radiology at [phone]. This test typically takes about 30 minutes to perform.  We have scheduled you a follow up with Quentin Mulling on 12/08/2022 at 3:00pm  Your provider has requested that you go to the basement level for lab work before leaving today. Press "B" on the elevator. The lab is located at the first door on the left as you exit the elevator.  First do a trial off milk/lactose products if you use them.  Add fiber like benefiber or citracel once a day Increase activity Can do trial of IBGard which is over the counter for AB pain- Take 1-2 capsules once a day for maintence or twice a day during a flare Can send in an anti spasm medication, Bentyl, to take as needed Please try to decrease stress. consider talking with PCP about anti anxiety medication or try head space app for meditation. if any worsening symptoms like blood in stool, weight loss, please call the office     FODMAP stands for fermentable oligo-, di-, mono-saccharides and polyols (1). These are the scientific terms used to classify groups of carbs that are difficult for our body to digest and that are notorious for triggering digestive symptoms like bloating, gas, loose stools and stomach pain.   You can try low FODMAP diet  - start with eliminating just one column at a time that you feel may be a trigger for you. - the table at the very bottom contains foods that are low in FODMAPs   Sometimes trying to eliminate the FODMAP's from your diet is difficult or tricky, if you are stuggling with trying to do the elimination diet you can try an enzyme.  There is a food enzymes that  you sprinkle in or on your food that helps break down the FODMAP. You can read more about the enzyme by going to this site: https://fodzyme.com/   Please take your proton pump inhibitor medication, prilosec 20 mg once daily for 6-8 weeks, then stop and do as needed  Please take this medication 30 minutes to 1 hour before meals- this makes it more effective.  Avoid spicy and acidic foods Avoid fatty foods Limit your intake of coffee, tea, alcohol, and carbonated drinks Work to maintain a healthy weight Keep the head of the bed elevated at least 3 inches with blocks or a wedge pillow if you are having any nighttime symptoms Stay upright for 2 hours after eating Avoid meals and snacks three to four hours before bedtime  Miralax is an osmotic laxative.  It only brings more water into the stool.  This is safe to take daily.  Can take up to 17 gram of miralax twice a day.  Mix with juice or coffee.  Start 1 capful at night for 3-4 days and reassess your response in 3-4 days.  You can increase and decrease the dose based on your response.  Remember, it can take up to 3-4 days to take effect OR for the effects to wear off.   I often pair this with benefiber in the morning to help assure the stool is not too loose.   _______________________________________________________  If your blood pressure at your visit was  140/90 or greater, please contact your primary care physician to follow up on this.  _______________________________________________________  If you are age 5 or older, your body mass index should be between 23-30. Your Body mass index is 31.64 kg/m. If this is out of the aforementioned range listed, please consider follow up with your Primary Care Provider.  If you are age 22 or younger, your body mass index should be between 19-25. Your Body mass index is 31.64 kg/m. If this is out of the aformentioned range listed, please consider follow up with your Primary Care Provider.    ________________________________________________________  The [facility] providers would like to encourage you to use [facility] to communicate with providers for non-urgent requests or questions.  Due to long hold times on the telephone, sending your provider a message by [facility] may be a faster and more efficient way to get a response.  Please allow 48 business hours for a response.  Please remember that this is for non-urgent requests.  _______________________________________________________ It was a pleasure to see you today!  Thank you for trusting me with your gastrointestinal care!

## 2022-09-21 LAB — TISSUE TRANSGLUTAMINASE, IGA: (tTG) Ab, IgA: <1 U/mL

## 2022-09-21 LAB — IGA: Immunoglobulin A: 308 mg/dL (ref 47–310)

## 2022-09-22 ENCOUNTER — Other Ambulatory Visit: Payer: No Typology Code available for payment source

## 2022-09-22 DIAGNOSIS — R1013 Epigastric pain: Secondary | ICD-10-CM

## 2022-09-27 ENCOUNTER — Ambulatory Visit (HOSPITAL_COMMUNITY)
Admission: RE | Admit: 2022-09-27 | Discharge: 2022-09-27 | Disposition: A | Discharge status: Home / Self Care | Payer: No Typology Code available for payment source | Source: Ambulatory Visit | Attending: Physician Assistant | Admitting: Physician Assistant

## 2022-09-27 DIAGNOSIS — R198 Other specified symptoms and signs involving the digestive system and abdomen: Secondary | ICD-10-CM | POA: Insufficient documentation

## 2022-09-27 DIAGNOSIS — R1013 Epigastric pain: Secondary | ICD-10-CM | POA: Diagnosis present

## 2022-11-10 NOTE — Progress Notes (Signed)
Agree with the assessment and plan as outlined by Quentin Mulling, PA-C.  If workup unrevealing and symptoms persisting despite interventions as outlined, can consider endoscopic evaluation with random and directed biopsies along with repeat cross-sectional imaging.  Lorren Splawn, DO, [facility]

## 2022-12-08 ENCOUNTER — Ambulatory Visit: Payer: No Typology Code available for payment source | Admitting: Physician Assistant

## 2022-12-16 ENCOUNTER — Other Ambulatory Visit: Payer: Self-pay | Admitting: Physician Assistant

## 2023-04-06 DIAGNOSIS — F411 Generalized anxiety disorder: Secondary | ICD-10-CM | POA: Diagnosis not present

## 2023-04-07 DIAGNOSIS — F411 Generalized anxiety disorder: Secondary | ICD-10-CM | POA: Diagnosis not present

## 2023-04-07 DIAGNOSIS — F332 Major depressive disorder, recurrent severe without psychotic features: Secondary | ICD-10-CM | POA: Diagnosis not present

## 2023-04-11 DIAGNOSIS — M9901 Segmental and somatic dysfunction of cervical region: Secondary | ICD-10-CM | POA: Diagnosis not present

## 2023-04-11 DIAGNOSIS — M9903 Segmental and somatic dysfunction of lumbar region: Secondary | ICD-10-CM | POA: Diagnosis not present

## 2023-04-11 DIAGNOSIS — M9905 Segmental and somatic dysfunction of pelvic region: Secondary | ICD-10-CM | POA: Diagnosis not present

## 2023-04-13 DIAGNOSIS — G35 Multiple sclerosis: Secondary | ICD-10-CM | POA: Diagnosis not present

## 2023-04-13 DIAGNOSIS — R768 Other specified abnormal immunological findings in serum: Secondary | ICD-10-CM | POA: Diagnosis not present

## 2023-04-14 DIAGNOSIS — F411 Generalized anxiety disorder: Secondary | ICD-10-CM | POA: Diagnosis not present

## 2023-04-18 DIAGNOSIS — Z6829 Body mass index (BMI) 29.0-29.9, adult: Secondary | ICD-10-CM | POA: Diagnosis not present

## 2023-04-18 DIAGNOSIS — Z01419 Encounter for gynecological examination (general) (routine) without abnormal findings: Secondary | ICD-10-CM | POA: Diagnosis not present

## 2023-04-27 DIAGNOSIS — F411 Generalized anxiety disorder: Secondary | ICD-10-CM | POA: Diagnosis not present

## 2023-05-02 DIAGNOSIS — M9905 Segmental and somatic dysfunction of pelvic region: Secondary | ICD-10-CM | POA: Diagnosis not present

## 2023-05-02 DIAGNOSIS — M9903 Segmental and somatic dysfunction of lumbar region: Secondary | ICD-10-CM | POA: Diagnosis not present

## 2023-05-02 DIAGNOSIS — R399 Unspecified symptoms and signs involving the genitourinary system: Secondary | ICD-10-CM | POA: Diagnosis not present

## 2023-05-02 DIAGNOSIS — M9901 Segmental and somatic dysfunction of cervical region: Secondary | ICD-10-CM | POA: Diagnosis not present

## 2023-05-05 DIAGNOSIS — F411 Generalized anxiety disorder: Secondary | ICD-10-CM | POA: Diagnosis not present

## 2023-05-10 ENCOUNTER — Other Ambulatory Visit: Payer: Self-pay

## 2023-05-10 DIAGNOSIS — N200 Calculus of kidney: Secondary | ICD-10-CM | POA: Insufficient documentation

## 2023-05-10 DIAGNOSIS — Z309 Encounter for contraceptive management, unspecified: Secondary | ICD-10-CM | POA: Diagnosis not present

## 2023-05-10 DIAGNOSIS — N134 Hydroureter: Secondary | ICD-10-CM | POA: Diagnosis not present

## 2023-05-10 DIAGNOSIS — R1031 Right lower quadrant pain: Secondary | ICD-10-CM | POA: Diagnosis not present

## 2023-05-10 DIAGNOSIS — N201 Calculus of ureter: Secondary | ICD-10-CM | POA: Diagnosis not present

## 2023-05-10 LAB — COMPREHENSIVE METABOLIC PANEL
ALT: 24 U/L (ref 0–44)
AST: 18 U/L (ref 15–41)
Albumin: 4.1 g/dL (ref 3.5–5.0)
Alkaline Phosphatase: 54 U/L (ref 38–126)
Anion gap: 8 (ref 5–15)
BUN: 12 mg/dL (ref 6–20)
CO2: 25 mmol/L (ref 22–32)
Calcium: 9.3 mg/dL (ref 8.9–10.3)
Chloride: 106 mmol/L (ref 98–111)
Creatinine, Ser: 0.7 mg/dL (ref 0.44–1.00)
GFR, Estimated: >60 mL/min (ref >60)
Glucose, Bld: 99 mg/dL (ref 70–99)
Potassium: 3.5 mmol/L (ref 3.5–5.1)
Sodium: 139 mmol/L (ref 135–145)
Total Bilirubin: 0.7 mg/dL (ref 0.0–1.2)
Total Protein: 7.3 g/dL (ref 6.5–8.1)

## 2023-05-10 LAB — CBC
HCT: 43.7 % (ref 36.0–46.0)
Hemoglobin: 13.8 g/dL (ref 12.0–15.0)
MCH: 27.4 pg (ref 26.0–34.0)
MCHC: 31.6 g/dL (ref 30.0–36.0)
MCV: 86.9 fL (ref 80.0–100.0)
Platelets: 311 10*3/uL (ref 150–400)
RBC: 5.03 MIL/uL (ref 3.87–5.11)
RDW: 12.7 % (ref 11.5–15.5)
WBC: 11.3 10*3/uL — ABNORMAL HIGH (ref 4.0–10.5)
nRBC: 0 % (ref 0.0–0.2)

## 2023-05-10 LAB — LIPASE, BLOOD: Lipase: 42 U/L (ref 11–51)

## 2023-05-10 NOTE — ED Triage Notes (Signed)
 Pt reports she had severe RLQ abd pain for aprox 5 min then resolved pt states she had nausea and inc urinary frequency. Pt denies hx abd surgeries. Denies dysuria

## 2023-05-11 ENCOUNTER — Emergency Department
Admission: EM | Admit: 2023-05-11 | Discharge: 2023-05-11 | Disposition: A | Discharge status: Home / Self Care | Payer: Self-pay | Attending: Emergency Medicine | Admitting: Emergency Medicine

## 2023-05-11 ENCOUNTER — Emergency Department

## 2023-05-11 DIAGNOSIS — N200 Calculus of kidney: Secondary | ICD-10-CM

## 2023-05-11 DIAGNOSIS — N134 Hydroureter: Secondary | ICD-10-CM | POA: Diagnosis not present

## 2023-05-11 DIAGNOSIS — N201 Calculus of ureter: Secondary | ICD-10-CM | POA: Diagnosis not present

## 2023-05-11 DIAGNOSIS — R1031 Right lower quadrant pain: Secondary | ICD-10-CM | POA: Diagnosis not present

## 2023-05-11 LAB — URINALYSIS, ROUTINE W REFLEX MICROSCOPIC
Bacteria, UA: NONE SEEN
Bilirubin Urine: NEGATIVE
Glucose, UA: NEGATIVE mg/dL
Hgb urine dipstick: NEGATIVE
Ketones, ur: NEGATIVE mg/dL
Leukocytes,Ua: NEGATIVE
Nitrite: POSITIVE — AB
Protein, ur: NEGATIVE mg/dL
Specific Gravity, Urine: 1.013 (ref 1.005–1.030)
pH: 8 (ref 5.0–8.0)

## 2023-05-11 LAB — POC URINE PREG, ED: Preg Test, Ur: NEGATIVE

## 2023-05-11 MED ORDER — ONDANSETRON HCL 4 MG PO TABS: 4.0000 mg | ORAL_TABLET | Freq: Three times a day (TID) | ORAL | 0 refills | Status: DC | PRN

## 2023-05-11 MED ORDER — KETOROLAC TROMETHAMINE 15 MG/ML IJ SOLN
15.0000 mg | Freq: Once | INTRAMUSCULAR | Status: AC
  Administered 2023-05-11: 15 mg via INTRAVENOUS
  Filled 2023-05-11: qty 1

## 2023-05-11 MED ORDER — OXYCODONE HCL 5 MG PO TABS: 5.0000 mg | ORAL_TABLET | Freq: Three times a day (TID) | ORAL | 0 refills | Status: DC | PRN

## 2023-05-11 MED ORDER — IOHEXOL 300 MG/ML  SOLN
100.0000 mL | Freq: Once | INTRAMUSCULAR | Status: AC | PRN
  Administered 2023-05-11: 100 mL via INTRAVENOUS

## 2023-05-11 NOTE — Discharge Instructions (Signed)
 You have a small 3 mm kidney stone on the right side which is the source of your symptoms today.  You can take ibuprofen 600 mg every 8 hours scheduled for the next 2 to 3 days.  I have also sent stronger pain medicine and nausea medication that you can take as needed.  Otherwise the stone should pass on its own with hydration.

## 2023-05-11 NOTE — ED Provider Notes (Signed)
 [facility] Provider Note    Event Date/Time   First MD Initiated Contact with Patient 05/11/23 [phone]     (approximate)   History   Abdominal Pain   HPI Francena Zender is a 33 y.o. female presenting today for abdominal pain.  Patient states onset of sharp right lower quadrant abdominal pain.  Felt like it radiated down into her groin.  Pain symptoms have eased up at this time.  Also having nausea but no vomiting.  Otherwise denies fever, chills, chest pain, difficulty breathing, diarrhea, constipation.  Intermittently feels dysuria symptoms but no hematuria.  No prior abdominal surgeries or history of kidney stones.     Physical Exam   Triage Vital Signs: ED Triage Vitals  Encounter Vitals Group     BP 05/10/23 2227 (!) 146/89     Systolic BP Percentile --      Diastolic BP Percentile --      Pulse Rate 05/10/23 2227 84     Resp 05/10/23 2227 20     Temp 05/10/23 2227 98.7 F (37.1 C)     Temp Source 05/10/23 2227 Oral     SpO2 05/10/23 2227 98 %     Weight 05/10/23 2226 167 lb (75.8 kg)     Height 05/10/23 2226 5\' 4"  (1.626 m)     Head Circumference --      Peak Flow --      Pain Score 05/10/23 2226 8     Pain Loc --      Pain Education --      Exclude from Growth Chart --     Most recent vital signs: Vitals:   05/10/23 2227 05/11/23 0212  BP: (!) 146/89 127/85  Pulse: 84 84  Resp: 20 17  Temp: 98.7 F (37.1 C) 98.4 F (36.9 C)  SpO2: 98% 100%   Physical Exam: I have reviewed the vital signs and nursing notes. General: Awake, alert, no acute distress.  Nontoxic appearing. Head:  Atraumatic, normocephalic.   ENT:  EOM intact, PERRL. Oral mucosa is pink and moist with no lesions. Neck: Neck is supple with full range of motion, No meningeal signs. Cardiovascular:  RRR, No murmurs. Peripheral pulses palpable and equal bilaterally. Respiratory:  Symmetrical chest wall expansion.  No rhonchi, rales, or wheezes.  Good air movement  throughout.  No use of accessory muscles.   Musculoskeletal:  No cyanosis or edema. Moving extremities with full ROM Abdomen:  Soft, nontender, nondistended. Neuro:  GCS 15, moving all four extremities, interacting appropriately. Speech clear. Psych:  Calm, appropriate.   Skin:  Warm, dry, no rash.    ED Results / Procedures / Treatments   Labs (all labs ordered are listed, but only abnormal results are displayed) Labs Reviewed  CBC - Abnormal; Notable for the following components:      Result Value   WBC 11.3 (*)    All other components within normal limits  URINALYSIS, ROUTINE W REFLEX MICROSCOPIC - Abnormal; Notable for the following components:   Color, Urine AMBER (*)    APPearance CLEAR (*)    Nitrite POSITIVE (*)    All other components within normal limits  LIPASE, BLOOD  COMPREHENSIVE METABOLIC PANEL  POC URINE PREG, ED     EKG    RADIOLOGY Independently interpreted CT imaging with evidence of 3 mm stone in the distal right ureter   PROCEDURES:  Critical Care performed: No  Procedures   MEDICATIONS ORDERED IN ED: Medications  ketorolac (  TORADOL) 15 MG/ML injection 15 mg (15 mg Intravenous Given 05/11/23 0238)  iohexol (OMNIPAQUE) 300 MG/ML solution 100 mL (100 mLs Intravenous Contrast Given 05/11/23 0258)     IMPRESSION / MDM / ASSESSMENT AND PLAN / ED COURSE  I reviewed the triage vital signs and the nursing notes.                              Differential diagnosis includes, but is not limited to, nephrolithiasis, pyelonephritis, acute cystitis, appendicitis  Patient's presentation is most consistent with acute complicated illness / injury requiring diagnostic workup.  Patient is a 33 year old female presenting today for right lower quadrant abdominal pain with nausea.  Physical exam largely unremarkable with no acute pain at this time.  Vital signs are stable.  Laboratory workup with slight leukocytosis but otherwise reassuring lipase and CMP.  UA  with no evidence of blood or obvious infection despite being nitrite positive.  CT imaging shows evidence of 3 mm distal right ureteral stone as the likely source of her symptoms today.  She was reassessed following Toradol administration and asymptomatic at this time.  Will discharge with symptomatic pain and nausea control at home and expected management of passing stone.  She was agreeable with plan and given strict return precautions.     FINAL CLINICAL IMPRESSION(S) / ED DIAGNOSES   Final diagnoses:  Nephrolithiasis     Rx / DC Orders   ED Discharge Orders          Ordered    ondansetron (ZOFRAN) 4 MG tablet  Every 8 hours PRN        05/11/23 0419    oxyCODONE (ROXICODONE) 5 MG immediate release tablet  Every 8 hours PRN        05/11/23 0419             Note:  This document was prepared using Dragon voice recognition software and may include unintentional dictation errors.   Janith Lima, MD 05/11/23 [phone]

## 2023-05-15 DIAGNOSIS — F411 Generalized anxiety disorder: Secondary | ICD-10-CM | POA: Diagnosis not present

## 2023-05-16 DIAGNOSIS — M9901 Segmental and somatic dysfunction of cervical region: Secondary | ICD-10-CM | POA: Diagnosis not present

## 2023-05-16 DIAGNOSIS — M9905 Segmental and somatic dysfunction of pelvic region: Secondary | ICD-10-CM | POA: Diagnosis not present

## 2023-05-16 DIAGNOSIS — M9903 Segmental and somatic dysfunction of lumbar region: Secondary | ICD-10-CM | POA: Diagnosis not present

## 2023-05-25 DIAGNOSIS — F411 Generalized anxiety disorder: Secondary | ICD-10-CM | POA: Diagnosis not present

## 2023-05-30 DIAGNOSIS — M9903 Segmental and somatic dysfunction of lumbar region: Secondary | ICD-10-CM | POA: Diagnosis not present

## 2023-05-30 DIAGNOSIS — M9901 Segmental and somatic dysfunction of cervical region: Secondary | ICD-10-CM | POA: Diagnosis not present

## 2023-05-30 DIAGNOSIS — M9905 Segmental and somatic dysfunction of pelvic region: Secondary | ICD-10-CM | POA: Diagnosis not present

## 2023-06-01 DIAGNOSIS — F411 Generalized anxiety disorder: Secondary | ICD-10-CM | POA: Diagnosis not present

## 2023-06-08 DIAGNOSIS — F411 Generalized anxiety disorder: Secondary | ICD-10-CM | POA: Diagnosis not present

## 2023-06-09 DIAGNOSIS — F411 Generalized anxiety disorder: Secondary | ICD-10-CM | POA: Diagnosis not present

## 2023-06-09 DIAGNOSIS — F332 Major depressive disorder, recurrent severe without psychotic features: Secondary | ICD-10-CM | POA: Diagnosis not present

## 2023-06-09 DIAGNOSIS — F5105 Insomnia due to other mental disorder: Secondary | ICD-10-CM | POA: Diagnosis not present

## 2023-06-15 DIAGNOSIS — R300 Dysuria: Secondary | ICD-10-CM | POA: Diagnosis not present

## 2023-06-21 DIAGNOSIS — M9903 Segmental and somatic dysfunction of lumbar region: Secondary | ICD-10-CM | POA: Diagnosis not present

## 2023-06-21 DIAGNOSIS — M9901 Segmental and somatic dysfunction of cervical region: Secondary | ICD-10-CM | POA: Diagnosis not present

## 2023-06-21 DIAGNOSIS — M9905 Segmental and somatic dysfunction of pelvic region: Secondary | ICD-10-CM | POA: Diagnosis not present

## 2023-06-22 DIAGNOSIS — F411 Generalized anxiety disorder: Secondary | ICD-10-CM | POA: Diagnosis not present

## 2023-06-22 DIAGNOSIS — F5105 Insomnia due to other mental disorder: Secondary | ICD-10-CM | POA: Diagnosis not present

## 2023-06-22 DIAGNOSIS — G2581 Restless legs syndrome: Secondary | ICD-10-CM | POA: Diagnosis not present

## 2023-06-23 DIAGNOSIS — F5105 Insomnia due to other mental disorder: Secondary | ICD-10-CM | POA: Diagnosis not present

## 2023-06-23 DIAGNOSIS — F411 Generalized anxiety disorder: Secondary | ICD-10-CM | POA: Diagnosis not present

## 2023-06-23 DIAGNOSIS — F332 Major depressive disorder, recurrent severe without psychotic features: Secondary | ICD-10-CM | POA: Diagnosis not present

## 2023-06-26 DIAGNOSIS — N390 Urinary tract infection, site not specified: Secondary | ICD-10-CM | POA: Diagnosis not present

## 2023-06-30 DIAGNOSIS — F411 Generalized anxiety disorder: Secondary | ICD-10-CM | POA: Diagnosis not present

## 2023-07-03 DIAGNOSIS — Z79899 Other long term (current) drug therapy: Secondary | ICD-10-CM | POA: Diagnosis not present

## 2023-07-03 DIAGNOSIS — N390 Urinary tract infection, site not specified: Secondary | ICD-10-CM | POA: Diagnosis not present

## 2023-07-03 DIAGNOSIS — G35 Multiple sclerosis: Secondary | ICD-10-CM | POA: Diagnosis not present

## 2023-07-03 DIAGNOSIS — R202 Paresthesia of skin: Secondary | ICD-10-CM | POA: Diagnosis not present

## 2023-07-03 DIAGNOSIS — Z7189 Other specified counseling: Secondary | ICD-10-CM | POA: Diagnosis not present

## 2023-07-04 DIAGNOSIS — M9905 Segmental and somatic dysfunction of pelvic region: Secondary | ICD-10-CM | POA: Diagnosis not present

## 2023-07-04 DIAGNOSIS — M9903 Segmental and somatic dysfunction of lumbar region: Secondary | ICD-10-CM | POA: Diagnosis not present

## 2023-07-04 DIAGNOSIS — M9902 Segmental and somatic dysfunction of thoracic region: Secondary | ICD-10-CM | POA: Diagnosis not present

## 2023-07-04 DIAGNOSIS — M9901 Segmental and somatic dysfunction of cervical region: Secondary | ICD-10-CM | POA: Diagnosis not present

## 2023-07-06 ENCOUNTER — Ambulatory Visit: Payer: No Typology Code available for payment source | Admitting: Dermatology

## 2023-07-06 DIAGNOSIS — F411 Generalized anxiety disorder: Secondary | ICD-10-CM | POA: Diagnosis not present

## 2023-07-11 DIAGNOSIS — R3915 Urgency of urination: Secondary | ICD-10-CM | POA: Diagnosis not present

## 2023-07-11 DIAGNOSIS — N201 Calculus of ureter: Secondary | ICD-10-CM | POA: Diagnosis not present

## 2023-07-11 DIAGNOSIS — R350 Frequency of micturition: Secondary | ICD-10-CM | POA: Diagnosis not present

## 2023-07-14 DIAGNOSIS — F411 Generalized anxiety disorder: Secondary | ICD-10-CM | POA: Diagnosis not present

## 2023-07-18 DIAGNOSIS — M9903 Segmental and somatic dysfunction of lumbar region: Secondary | ICD-10-CM | POA: Diagnosis not present

## 2023-07-18 DIAGNOSIS — M9905 Segmental and somatic dysfunction of pelvic region: Secondary | ICD-10-CM | POA: Diagnosis not present

## 2023-07-18 DIAGNOSIS — M9902 Segmental and somatic dysfunction of thoracic region: Secondary | ICD-10-CM | POA: Diagnosis not present

## 2023-07-18 DIAGNOSIS — M9901 Segmental and somatic dysfunction of cervical region: Secondary | ICD-10-CM | POA: Diagnosis not present

## 2023-07-20 DIAGNOSIS — F411 Generalized anxiety disorder: Secondary | ICD-10-CM | POA: Diagnosis not present

## 2023-07-27 DIAGNOSIS — F411 Generalized anxiety disorder: Secondary | ICD-10-CM | POA: Diagnosis not present

## 2023-08-01 DIAGNOSIS — M9902 Segmental and somatic dysfunction of thoracic region: Secondary | ICD-10-CM | POA: Diagnosis not present

## 2023-08-01 DIAGNOSIS — M9901 Segmental and somatic dysfunction of cervical region: Secondary | ICD-10-CM | POA: Diagnosis not present

## 2023-08-01 DIAGNOSIS — M9903 Segmental and somatic dysfunction of lumbar region: Secondary | ICD-10-CM | POA: Diagnosis not present

## 2023-08-01 DIAGNOSIS — M9905 Segmental and somatic dysfunction of pelvic region: Secondary | ICD-10-CM | POA: Diagnosis not present

## 2023-08-04 DIAGNOSIS — R350 Frequency of micturition: Secondary | ICD-10-CM | POA: Diagnosis not present

## 2023-08-04 DIAGNOSIS — N201 Calculus of ureter: Secondary | ICD-10-CM | POA: Diagnosis not present

## 2023-08-04 DIAGNOSIS — R3915 Urgency of urination: Secondary | ICD-10-CM | POA: Diagnosis not present

## 2023-08-08 ENCOUNTER — Emergency Department
Admission: EM | Admit: 2023-08-08 | Discharge: 2023-08-08 | Disposition: A | Discharge status: Home / Self Care | Attending: Emergency Medicine | Admitting: Emergency Medicine

## 2023-08-08 ENCOUNTER — Emergency Department

## 2023-08-08 ENCOUNTER — Other Ambulatory Visit: Payer: Self-pay

## 2023-08-08 DIAGNOSIS — N201 Calculus of ureter: Secondary | ICD-10-CM | POA: Diagnosis not present

## 2023-08-08 DIAGNOSIS — R109 Unspecified abdominal pain: Secondary | ICD-10-CM | POA: Diagnosis not present

## 2023-08-08 DIAGNOSIS — N390 Urinary tract infection, site not specified: Secondary | ICD-10-CM | POA: Diagnosis not present

## 2023-08-08 DIAGNOSIS — D72829 Elevated white blood cell count, unspecified: Secondary | ICD-10-CM | POA: Diagnosis not present

## 2023-08-08 DIAGNOSIS — N132 Hydronephrosis with renal and ureteral calculous obstruction: Secondary | ICD-10-CM | POA: Diagnosis not present

## 2023-08-08 DIAGNOSIS — R399 Unspecified symptoms and signs involving the genitourinary system: Secondary | ICD-10-CM | POA: Diagnosis not present

## 2023-08-08 LAB — URINALYSIS, ROUTINE W REFLEX MICROSCOPIC
Bilirubin Urine: NEGATIVE
Glucose, UA: NEGATIVE mg/dL
Ketones, ur: 5 mg/dL — AB
Leukocytes,Ua: NEGATIVE
Nitrite: POSITIVE — AB
Protein, ur: NEGATIVE mg/dL
Specific Gravity, Urine: 1.006 (ref 1.005–1.030)
Squamous Epithelial / HPF: 0 /HPF (ref 0–5)
pH: 7 (ref 5.0–8.0)

## 2023-08-08 LAB — CBC WITH DIFFERENTIAL/PLATELET
Abs Immature Granulocytes: 0.03 10*3/uL (ref 0.00–0.07)
Basophils Absolute: 0.1 10*3/uL (ref 0.0–0.1)
Basophils Relative: 1 %
Eosinophils Absolute: 0.1 10*3/uL (ref 0.0–0.5)
Eosinophils Relative: 1 %
HCT: 40.7 % (ref 36.0–46.0)
Hemoglobin: 13.3 g/dL (ref 12.0–15.0)
Immature Granulocytes: 0 %
Lymphocytes Relative: 11 %
Lymphs Abs: 1.2 10*3/uL (ref 0.7–4.0)
MCH: 27.8 pg (ref 26.0–34.0)
MCHC: 32.7 g/dL (ref 30.0–36.0)
MCV: 85 fL (ref 80.0–100.0)
Monocytes Absolute: 0.6 10*3/uL (ref 0.1–1.0)
Monocytes Relative: 5 %
Neutro Abs: 9.1 10*3/uL — ABNORMAL HIGH (ref 1.7–7.7)
Neutrophils Relative %: 82 %
Platelets: 306 10*3/uL (ref 150–400)
RBC: 4.79 MIL/uL (ref 3.87–5.11)
RDW: 12.6 % (ref 11.5–15.5)
WBC: 11 10*3/uL — ABNORMAL HIGH (ref 4.0–10.5)
nRBC: 0 % (ref 0.0–0.2)

## 2023-08-08 LAB — COMPREHENSIVE METABOLIC PANEL WITH GFR
ALT: 24 U/L (ref 0–44)
AST: 19 U/L (ref 15–41)
Albumin: 4.2 g/dL (ref 3.5–5.0)
Alkaline Phosphatase: 52 U/L (ref 38–126)
Anion gap: 10 (ref 5–15)
BUN: 11 mg/dL (ref 6–20)
CO2: 24 mmol/L (ref 22–32)
Calcium: 9.1 mg/dL (ref 8.9–10.3)
Chloride: 103 mmol/L (ref 98–111)
Creatinine, Ser: 0.99 mg/dL (ref 0.44–1.00)
GFR, Estimated: >60 mL/min (ref >60)
Glucose, Bld: 106 mg/dL — ABNORMAL HIGH (ref 70–99)
Potassium: 4 mmol/L (ref 3.5–5.1)
Sodium: 137 mmol/L (ref 135–145)
Total Bilirubin: 0.8 mg/dL (ref 0.0–1.2)
Total Protein: 7.3 g/dL (ref 6.5–8.1)

## 2023-08-08 LAB — POC URINE PREG, ED: Preg Test, Ur: NEGATIVE

## 2023-08-08 MED ORDER — ONDANSETRON 4 MG PO TBDP: 4.0000 mg | ORAL_TABLET | Freq: Three times a day (TID) | ORAL | 0 refills | Status: AC | PRN

## 2023-08-08 MED ORDER — ONDANSETRON 4 MG PO TBDP
ORAL_TABLET | ORAL | Status: AC
  Filled 2023-08-08: qty 1

## 2023-08-08 MED ORDER — ONDANSETRON 4 MG PO TBDP
4.0000 mg | ORAL_TABLET | Freq: Once | ORAL | Status: AC
  Administered 2023-08-08: 4 mg via ORAL

## 2023-08-08 MED ORDER — OXYCODONE-ACETAMINOPHEN 5-325 MG PO TABS
1.0000 | ORAL_TABLET | Freq: Once | ORAL | Status: AC
  Administered 2023-08-08: 1 via ORAL
  Filled 2023-08-08: qty 1

## 2023-08-08 MED ORDER — OXYCODONE-ACETAMINOPHEN 5-325 MG PO TABS: 1.0000 | ORAL_TABLET | Freq: Four times a day (QID) | ORAL | 0 refills | Status: AC | PRN

## 2023-08-08 MED ORDER — TAMSULOSIN HCL 0.4 MG PO CAPS: 0.4000 mg | ORAL_CAPSULE | Freq: Every day | ORAL | 0 refills | Status: AC

## 2023-08-08 NOTE — ED Provider Triage Note (Signed)
 Emergency Medicine Provider Triage Evaluation Note  Sophia Hampton , a 33 y.o. female  was evaluated in triage.  Pt complains of flank pain, history of a stone that urology said she did not pass, now having pain and blood in urine.  Review of Systems  Positive: Flank pain Negative:   Physical Exam  BP 120/89 (BP Location: Right Arm)   Pulse 77   Temp 99.2 F (37.3 C) (Oral)   Resp 18   Ht 5\' 2"  (1.575 m)   Wt 73.9 kg   LMP 07/17/2023 (Exact Date)   SpO2 99%   BMI 29.81 kg/m  Gen:   Awake, no distress   Resp:  Normal effort  MSK:   Moves extremities without difficulty  Other:    Medical Decision Making  Medically screening exam initiated at 7:35 PM.  Appropriate orders placed.  Sophia Hampton was informed that the remainder of the evaluation will be completed by another provider, this initial triage assessment does not replace that evaluation, and the importance of remaining in the ED until their evaluation is complete.     Sophia Breen, PA-C 08/08/23 1937

## 2023-08-08 NOTE — ED Triage Notes (Signed)
 Pt reports she was seen and told she had a 3mm kidney stone back in march, pt states she was told it would pass on its own and was given abx for an infection. Pt states she followed up with urology and they said the stone never passed, pt reports for the past 3 days she has developed pain again to her right flank. Pt states she has also developed nausea and blood in her urine

## 2023-08-08 NOTE — Discharge Instructions (Addendum)
 Your blood work was reassuring. Your urine does not show signs of infection.   Please follow-up with your urologist or the one that I have attached.  You can take 650 mg of Tylenol  and 600 mg of ibuprofen  every 6 hours as needed for pain. You can use ice, heat, muscle creams and other topical pain relievers as well.  I have sent a strong pain medication called Percocet to the pharmacy.  This medication can be taken every 4-6 hours as needed for severe or breakthrough pain.  This medication can cause dependency so only take if you are unable to control your pain with other medications.  It will make you sleepy so do not drive or operate heavy machinery after taking it.  Do not drink alcohol while taking this medication.  This medication can also cause constipation so please take an over-the-counter stool softener like MiraLAX or Colace while taking it.  This medication contains both oxycodone  and acetaminophen .  Do not take Tylenol  at the same time.  You can take the Percocet or Tylenol  but not both.  Please take the tamsulosin as prescribed. You can take the zofran  as needed for nausea.  Stop taking the cephalexin.

## 2023-08-08 NOTE — ED Provider Notes (Signed)
 [facility] Provider Note    Event Date/Time   First MD Initiated Contact with Patient 08/08/23 2118     (approximate)   History   Flank Pain   HPI  Sophia Hampton is a 33 y.o. female with PMH of multiple sclerosis, anemia, anxiety presents for evaluation of right flank pain.  Patient was previously told she had a 3 mm kidney stone back in March.  She was given antibiotics to treat the infection.  She followed up with urology and was told that the stone never passed.  She presents today she has developed pain in her right flank.  She is also had nausea and blood in her urine.      Physical Exam   Triage Vital Signs: ED Triage Vitals  Encounter Vitals Group     BP 08/08/23 1924 120/89     Systolic BP Percentile --      Diastolic BP Percentile --      Pulse Rate 08/08/23 1924 77     Resp 08/08/23 1924 18     Temp 08/08/23 1924 99.2 F (37.3 C)     Temp Source 08/08/23 1924 Oral     SpO2 08/08/23 1924 99 %     Weight 08/08/23 1925 163 lb (73.9 kg)     Height 08/08/23 1925 5\' 2"  (1.575 m)     Head Circumference --      Peak Flow --      Pain Score 08/08/23 1935 6     Pain Loc --      Pain Education --      Exclude from Growth Chart --     Most recent vital signs: Vitals:   08/08/23 1924  BP: 120/89  Pulse: 77  Resp: 18  Temp: 99.2 F (37.3 C)  SpO2: 99%   General: Awake, no distress.  CV:  Good peripheral perfusion. RRR. Resp:  Normal effort. CTAB. Abd:  No distention. CVA tenderness right side. Other:     ED Results / Procedures / Treatments   Labs (all labs ordered are listed, but only abnormal results are displayed) Labs Reviewed  URINALYSIS, ROUTINE W REFLEX MICROSCOPIC - Abnormal; Notable for the following components:      Result Value   Color, Urine AMBER (*)    APPearance CLEAR (*)    Hgb urine dipstick SMALL (*)    Ketones, ur 5 (*)    Nitrite POSITIVE (*)    Bacteria, UA RARE (*)    All other components within  normal limits  COMPREHENSIVE METABOLIC PANEL WITH GFR - Abnormal; Notable for the following components:   Glucose, Bld 106 (*)    All other components within normal limits  CBC WITH DIFFERENTIAL/PLATELET - Abnormal; Notable for the following components:   WBC 11.0 (*)    Neutro Abs 9.1 (*)    All other components within normal limits  POC URINE PREG, ED    RADIOLOGY  CT renal stone study obtained, interpreted the images as well as reviewed the radiologist report which shows an obstructive 3 mm right ureterovesicular junction stone.    PROCEDURES:  Critical Care performed: No  Procedures   MEDICATIONS ORDERED IN ED: Medications  oxyCODONE -acetaminophen  (PERCOCET/ROXICET) 5-325 MG per tablet 1 tablet (1 tablet Oral Given 08/08/23 2217)  ondansetron  (ZOFRAN -ODT) disintegrating tablet 4 mg (4 mg Oral Given 08/08/23 2218)     IMPRESSION / MDM / ASSESSMENT AND PLAN / ED COURSE  I reviewed the triage vital signs and  the nursing notes.                             33 year old female presents for evaluation of right-sided flank pain.  Vital signs stable patient NAD on exam.  Differential diagnosis includes, but is not limited to, ureterolithiasis, UTI, pyelonephritis, muscle strain.  Patient's presentation is most consistent with acute complicated illness / injury requiring diagnostic workup.  CBC shows mild leukocytosis otherwise normal.  CMP unremarkable.  Pregnancy test is negative.  Urinalysis shows some ketones with positive nitrites but no white blood cells and rare bacteria.  Suspect that the nitrates is a false positive.  CT renal stone study shows a 3 mm obstructing stone at the ureterovesicular junction.  Believe this is the same stone from the scan done in March.  Spoke with the on-call urology provider, Dr. Cherylene Corrente for recommendations on management.  He did not think patient needed to be on antibiotics for UTI.  Recommended that she follow-up in the office to discuss  surgical options as it has been 3 months without her passing the stone.  Patient will be discharged with pain medication, nausea medication and tamsulosin.  She has follow-up with her urologist tomorrow.  Patient voiced understanding, all questions were answered and she was stable at discharge.     FINAL CLINICAL IMPRESSION(S) / ED DIAGNOSES   Final diagnoses:  Ureterolithiasis     Rx / DC Orders   ED Discharge Orders          Ordered    tamsulosin (FLOMAX) 0.4 MG CAPS capsule  Daily        08/08/23 2217    oxyCODONE -acetaminophen  (PERCOCET) 5-325 MG tablet  Every 6 hours PRN        08/08/23 2217    ondansetron  (ZOFRAN -ODT) 4 MG disintegrating tablet  Every 8 hours PRN        08/08/23 2217             Note:  This document was prepared using Dragon voice recognition software and may include unintentional dictation errors.   [name], PA-C 08/08/23 2219    Claria Crofts, MD 08/08/23 [phone]

## 2023-08-09 DIAGNOSIS — R350 Frequency of micturition: Secondary | ICD-10-CM | POA: Diagnosis not present

## 2023-08-09 DIAGNOSIS — N132 Hydronephrosis with renal and ureteral calculous obstruction: Secondary | ICD-10-CM | POA: Diagnosis not present

## 2023-08-14 DIAGNOSIS — N3001 Acute cystitis with hematuria: Secondary | ICD-10-CM | POA: Diagnosis not present

## 2023-08-14 DIAGNOSIS — R300 Dysuria: Secondary | ICD-10-CM | POA: Diagnosis not present

## 2023-08-14 DIAGNOSIS — N200 Calculus of kidney: Secondary | ICD-10-CM | POA: Diagnosis not present

## 2023-08-14 DIAGNOSIS — R399 Unspecified symptoms and signs involving the genitourinary system: Secondary | ICD-10-CM | POA: Diagnosis not present

## 2023-08-15 ENCOUNTER — Other Ambulatory Visit: Payer: Self-pay | Admitting: Urology

## 2023-08-16 DIAGNOSIS — F5105 Insomnia due to other mental disorder: Secondary | ICD-10-CM | POA: Diagnosis not present

## 2023-08-16 DIAGNOSIS — F332 Major depressive disorder, recurrent severe without psychotic features: Secondary | ICD-10-CM | POA: Diagnosis not present

## 2023-08-16 DIAGNOSIS — F411 Generalized anxiety disorder: Secondary | ICD-10-CM | POA: Diagnosis not present

## 2023-08-17 DIAGNOSIS — F411 Generalized anxiety disorder: Secondary | ICD-10-CM | POA: Diagnosis not present

## 2023-08-24 DIAGNOSIS — F411 Generalized anxiety disorder: Secondary | ICD-10-CM | POA: Diagnosis not present

## 2023-08-27 ENCOUNTER — Telehealth: Payer: Self-pay | Admitting: Urology

## 2023-08-27 DIAGNOSIS — N201 Calculus of ureter: Secondary | ICD-10-CM

## 2023-08-27 NOTE — Telephone Encounter (Signed)
 Please schedule new patient appointment with [facility]

## 2023-08-29 DIAGNOSIS — M9903 Segmental and somatic dysfunction of lumbar region: Secondary | ICD-10-CM | POA: Diagnosis not present

## 2023-08-29 DIAGNOSIS — M9905 Segmental and somatic dysfunction of pelvic region: Secondary | ICD-10-CM | POA: Diagnosis not present

## 2023-08-29 DIAGNOSIS — M9902 Segmental and somatic dysfunction of thoracic region: Secondary | ICD-10-CM | POA: Diagnosis not present

## 2023-08-29 DIAGNOSIS — M9901 Segmental and somatic dysfunction of cervical region: Secondary | ICD-10-CM | POA: Diagnosis not present

## 2023-08-31 DIAGNOSIS — G35 Multiple sclerosis: Secondary | ICD-10-CM | POA: Diagnosis not present

## 2023-09-05 ENCOUNTER — Encounter (HOSPITAL_COMMUNITY): Admission: RE | Admit: 2023-09-05 | Source: Ambulatory Visit

## 2023-09-08 DIAGNOSIS — R399 Unspecified symptoms and signs involving the genitourinary system: Secondary | ICD-10-CM | POA: Diagnosis not present

## 2023-09-19 DIAGNOSIS — N201 Calculus of ureter: Secondary | ICD-10-CM | POA: Diagnosis not present

## 2023-09-21 ENCOUNTER — Ambulatory Visit (HOSPITAL_COMMUNITY): Admit: 2023-09-21 | Admitting: Urology

## 2023-09-21 SURGERY — CYSTOSCOPY/URETEROSCOPY/HOLMIUM LASER/STENT PLACEMENT
Anesthesia: General | Laterality: Right

## 2023-09-22 DIAGNOSIS — F411 Generalized anxiety disorder: Secondary | ICD-10-CM | POA: Diagnosis not present

## 2023-09-25 DIAGNOSIS — M9903 Segmental and somatic dysfunction of lumbar region: Secondary | ICD-10-CM | POA: Diagnosis not present

## 2023-09-25 DIAGNOSIS — M9902 Segmental and somatic dysfunction of thoracic region: Secondary | ICD-10-CM | POA: Diagnosis not present

## 2023-09-25 DIAGNOSIS — M9901 Segmental and somatic dysfunction of cervical region: Secondary | ICD-10-CM | POA: Diagnosis not present

## 2023-09-25 DIAGNOSIS — M9905 Segmental and somatic dysfunction of pelvic region: Secondary | ICD-10-CM | POA: Diagnosis not present

## 2023-09-26 DIAGNOSIS — F5105 Insomnia due to other mental disorder: Secondary | ICD-10-CM | POA: Diagnosis not present

## 2023-09-26 DIAGNOSIS — F411 Generalized anxiety disorder: Secondary | ICD-10-CM | POA: Diagnosis not present

## 2023-09-26 DIAGNOSIS — F332 Major depressive disorder, recurrent severe without psychotic features: Secondary | ICD-10-CM | POA: Diagnosis not present

## 2023-09-28 DIAGNOSIS — F411 Generalized anxiety disorder: Secondary | ICD-10-CM | POA: Diagnosis not present

## 2023-10-11 DIAGNOSIS — M9903 Segmental and somatic dysfunction of lumbar region: Secondary | ICD-10-CM | POA: Diagnosis not present

## 2023-10-11 DIAGNOSIS — M9905 Segmental and somatic dysfunction of pelvic region: Secondary | ICD-10-CM | POA: Diagnosis not present

## 2023-10-11 DIAGNOSIS — M9902 Segmental and somatic dysfunction of thoracic region: Secondary | ICD-10-CM | POA: Diagnosis not present

## 2023-10-11 DIAGNOSIS — M9901 Segmental and somatic dysfunction of cervical region: Secondary | ICD-10-CM | POA: Diagnosis not present

## 2023-10-12 DIAGNOSIS — F411 Generalized anxiety disorder: Secondary | ICD-10-CM | POA: Diagnosis not present

## 2023-10-18 DIAGNOSIS — G35 Multiple sclerosis: Secondary | ICD-10-CM | POA: Diagnosis not present

## 2023-10-18 DIAGNOSIS — G43111 Migraine with aura, intractable, with status migrainosus: Secondary | ICD-10-CM | POA: Diagnosis not present

## 2023-10-18 DIAGNOSIS — E559 Vitamin D deficiency, unspecified: Secondary | ICD-10-CM | POA: Diagnosis not present

## 2023-10-18 DIAGNOSIS — E611 Iron deficiency: Secondary | ICD-10-CM | POA: Diagnosis not present

## 2023-10-18 DIAGNOSIS — Z0000 Encounter for general adult medical examination without abnormal findings: Secondary | ICD-10-CM | POA: Diagnosis not present

## 2023-10-18 DIAGNOSIS — E782 Mixed hyperlipidemia: Secondary | ICD-10-CM | POA: Diagnosis not present

## 2023-10-18 DIAGNOSIS — E663 Overweight: Secondary | ICD-10-CM | POA: Diagnosis not present

## 2023-10-18 DIAGNOSIS — L659 Nonscarring hair loss, unspecified: Secondary | ICD-10-CM | POA: Diagnosis not present

## 2023-10-19 DIAGNOSIS — F411 Generalized anxiety disorder: Secondary | ICD-10-CM | POA: Diagnosis not present

## 2023-10-25 DIAGNOSIS — M9905 Segmental and somatic dysfunction of pelvic region: Secondary | ICD-10-CM | POA: Diagnosis not present

## 2023-10-25 DIAGNOSIS — M9903 Segmental and somatic dysfunction of lumbar region: Secondary | ICD-10-CM | POA: Diagnosis not present

## 2023-10-25 DIAGNOSIS — M9902 Segmental and somatic dysfunction of thoracic region: Secondary | ICD-10-CM | POA: Diagnosis not present

## 2023-10-25 DIAGNOSIS — M9901 Segmental and somatic dysfunction of cervical region: Secondary | ICD-10-CM | POA: Diagnosis not present

## 2023-10-26 DIAGNOSIS — F411 Generalized anxiety disorder: Secondary | ICD-10-CM | POA: Diagnosis not present

## 2023-10-27 DIAGNOSIS — F411 Generalized anxiety disorder: Secondary | ICD-10-CM | POA: Diagnosis not present

## 2023-10-27 DIAGNOSIS — F332 Major depressive disorder, recurrent severe without psychotic features: Secondary | ICD-10-CM | POA: Diagnosis not present

## 2023-11-02 DIAGNOSIS — F411 Generalized anxiety disorder: Secondary | ICD-10-CM | POA: Diagnosis not present

## 2023-11-08 DIAGNOSIS — M9902 Segmental and somatic dysfunction of thoracic region: Secondary | ICD-10-CM | POA: Diagnosis not present

## 2023-11-08 DIAGNOSIS — M9903 Segmental and somatic dysfunction of lumbar region: Secondary | ICD-10-CM | POA: Diagnosis not present

## 2023-11-08 DIAGNOSIS — M9905 Segmental and somatic dysfunction of pelvic region: Secondary | ICD-10-CM | POA: Diagnosis not present

## 2023-11-08 DIAGNOSIS — M9901 Segmental and somatic dysfunction of cervical region: Secondary | ICD-10-CM | POA: Diagnosis not present

## 2023-11-16 DIAGNOSIS — F411 Generalized anxiety disorder: Secondary | ICD-10-CM | POA: Diagnosis not present

## 2023-11-21 DIAGNOSIS — F5105 Insomnia due to other mental disorder: Secondary | ICD-10-CM | POA: Diagnosis not present

## 2023-11-21 DIAGNOSIS — F411 Generalized anxiety disorder: Secondary | ICD-10-CM | POA: Diagnosis not present

## 2023-11-21 DIAGNOSIS — F332 Major depressive disorder, recurrent severe without psychotic features: Secondary | ICD-10-CM | POA: Diagnosis not present

## 2023-11-22 DIAGNOSIS — M9903 Segmental and somatic dysfunction of lumbar region: Secondary | ICD-10-CM | POA: Diagnosis not present

## 2023-11-22 DIAGNOSIS — M9905 Segmental and somatic dysfunction of pelvic region: Secondary | ICD-10-CM | POA: Diagnosis not present

## 2023-11-22 DIAGNOSIS — M9901 Segmental and somatic dysfunction of cervical region: Secondary | ICD-10-CM | POA: Diagnosis not present

## 2023-11-22 DIAGNOSIS — M9902 Segmental and somatic dysfunction of thoracic region: Secondary | ICD-10-CM | POA: Diagnosis not present

## 2023-11-23 DIAGNOSIS — F411 Generalized anxiety disorder: Secondary | ICD-10-CM | POA: Diagnosis not present

## 2023-12-04 DIAGNOSIS — F411 Generalized anxiety disorder: Secondary | ICD-10-CM | POA: Diagnosis not present

## 2023-12-04 DIAGNOSIS — F5105 Insomnia due to other mental disorder: Secondary | ICD-10-CM | POA: Diagnosis not present

## 2023-12-04 DIAGNOSIS — F332 Major depressive disorder, recurrent severe without psychotic features: Secondary | ICD-10-CM | POA: Diagnosis not present

## 2023-12-05 DIAGNOSIS — M9901 Segmental and somatic dysfunction of cervical region: Secondary | ICD-10-CM | POA: Diagnosis not present

## 2023-12-05 DIAGNOSIS — M9902 Segmental and somatic dysfunction of thoracic region: Secondary | ICD-10-CM | POA: Diagnosis not present

## 2023-12-05 DIAGNOSIS — M9905 Segmental and somatic dysfunction of pelvic region: Secondary | ICD-10-CM | POA: Diagnosis not present

## 2023-12-05 DIAGNOSIS — M9903 Segmental and somatic dysfunction of lumbar region: Secondary | ICD-10-CM | POA: Diagnosis not present

## 2023-12-06 DIAGNOSIS — N390 Urinary tract infection, site not specified: Secondary | ICD-10-CM | POA: Diagnosis not present

## 2023-12-06 DIAGNOSIS — L659 Nonscarring hair loss, unspecified: Secondary | ICD-10-CM | POA: Diagnosis not present

## 2023-12-06 DIAGNOSIS — R5383 Other fatigue: Secondary | ICD-10-CM | POA: Diagnosis not present

## 2023-12-06 DIAGNOSIS — N912 Amenorrhea, unspecified: Secondary | ICD-10-CM | POA: Diagnosis not present

## 2023-12-07 DIAGNOSIS — F411 Generalized anxiety disorder: Secondary | ICD-10-CM | POA: Diagnosis not present

## 2023-12-14 DIAGNOSIS — F411 Generalized anxiety disorder: Secondary | ICD-10-CM | POA: Diagnosis not present

## 2023-12-19 DIAGNOSIS — M9903 Segmental and somatic dysfunction of lumbar region: Secondary | ICD-10-CM | POA: Diagnosis not present

## 2023-12-19 DIAGNOSIS — M9905 Segmental and somatic dysfunction of pelvic region: Secondary | ICD-10-CM | POA: Diagnosis not present

## 2023-12-19 DIAGNOSIS — M9901 Segmental and somatic dysfunction of cervical region: Secondary | ICD-10-CM | POA: Diagnosis not present

## 2023-12-19 DIAGNOSIS — M9902 Segmental and somatic dysfunction of thoracic region: Secondary | ICD-10-CM | POA: Diagnosis not present

## 2023-12-21 DIAGNOSIS — F411 Generalized anxiety disorder: Secondary | ICD-10-CM | POA: Diagnosis not present

## 2023-12-22 DIAGNOSIS — F411 Generalized anxiety disorder: Secondary | ICD-10-CM | POA: Diagnosis not present

## 2023-12-22 DIAGNOSIS — F332 Major depressive disorder, recurrent severe without psychotic features: Secondary | ICD-10-CM | POA: Diagnosis not present

## 2023-12-28 DIAGNOSIS — F411 Generalized anxiety disorder: Secondary | ICD-10-CM | POA: Diagnosis not present

## 2024-01-02 DIAGNOSIS — M9903 Segmental and somatic dysfunction of lumbar region: Secondary | ICD-10-CM | POA: Diagnosis not present

## 2024-01-02 DIAGNOSIS — M9905 Segmental and somatic dysfunction of pelvic region: Secondary | ICD-10-CM | POA: Diagnosis not present

## 2024-01-02 DIAGNOSIS — M9901 Segmental and somatic dysfunction of cervical region: Secondary | ICD-10-CM | POA: Diagnosis not present

## 2024-01-02 DIAGNOSIS — M9902 Segmental and somatic dysfunction of thoracic region: Secondary | ICD-10-CM | POA: Diagnosis not present

## 2024-01-11 DIAGNOSIS — F411 Generalized anxiety disorder: Secondary | ICD-10-CM | POA: Diagnosis not present

## 2024-01-12 DIAGNOSIS — R051 Acute cough: Secondary | ICD-10-CM | POA: Diagnosis not present

## 2024-01-12 DIAGNOSIS — J029 Acute pharyngitis, unspecified: Secondary | ICD-10-CM | POA: Diagnosis not present

## 2024-01-12 DIAGNOSIS — J019 Acute sinusitis, unspecified: Secondary | ICD-10-CM | POA: Diagnosis not present

## 2024-01-23 DIAGNOSIS — F411 Generalized anxiety disorder: Secondary | ICD-10-CM | POA: Diagnosis not present

## 2024-01-31 DIAGNOSIS — F5105 Insomnia due to other mental disorder: Secondary | ICD-10-CM | POA: Diagnosis not present

## 2024-01-31 DIAGNOSIS — F411 Generalized anxiety disorder: Secondary | ICD-10-CM | POA: Diagnosis not present

## 2024-01-31 DIAGNOSIS — F339 Major depressive disorder, recurrent, unspecified: Secondary | ICD-10-CM | POA: Diagnosis not present

## 2024-02-01 DIAGNOSIS — F411 Generalized anxiety disorder: Secondary | ICD-10-CM | POA: Diagnosis not present

## 2024-02-05 DIAGNOSIS — M47814 Spondylosis without myelopathy or radiculopathy, thoracic region: Secondary | ICD-10-CM | POA: Diagnosis not present

## 2024-02-05 DIAGNOSIS — M47812 Spondylosis without myelopathy or radiculopathy, cervical region: Secondary | ICD-10-CM | POA: Diagnosis not present

## 2024-02-08 DIAGNOSIS — F411 Generalized anxiety disorder: Secondary | ICD-10-CM | POA: Diagnosis not present

## 2024-02-15 DIAGNOSIS — F411 Generalized anxiety disorder: Secondary | ICD-10-CM | POA: Diagnosis not present
